# Patient Record
Sex: Male | Born: 1995 | Race: White | Hispanic: No | Marital: Single | Smoking: Never smoker
Health system: Southern US, Community
[De-identification: ages and names within clinical notes are randomized; demographics above are authoritative.]

## PROBLEM LIST (undated history)

## (undated) DIAGNOSIS — N200 Calculus of kidney: Secondary | ICD-10-CM

## (undated) DIAGNOSIS — F909 Attention-deficit hyperactivity disorder, unspecified type: Secondary | ICD-10-CM

---
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
## 2017-12-10 ENCOUNTER — Ambulatory Visit: Payer: Self-pay | Admitting: Psychiatry

## 2017-12-10 ENCOUNTER — Encounter: Payer: Self-pay | Admitting: Psychiatry

## 2017-12-10 VITALS — BP 118/72 | HR 68 | Ht 66.0 in | Wt 177.0 lb

## 2017-12-10 DIAGNOSIS — F429 Obsessive-compulsive disorder, unspecified: Secondary | ICD-10-CM | POA: Insufficient documentation

## 2017-12-10 DIAGNOSIS — F341 Dysthymic disorder: Secondary | ICD-10-CM

## 2017-12-10 DIAGNOSIS — F902 Attention-deficit hyperactivity disorder, combined type: Secondary | ICD-10-CM

## 2017-12-10 DIAGNOSIS — F909 Attention-deficit hyperactivity disorder, unspecified type: Secondary | ICD-10-CM

## 2017-12-10 DIAGNOSIS — F428 Other obsessive-compulsive disorder: Secondary | ICD-10-CM

## 2017-12-10 MED ORDER — METHYLPHENIDATE HCL ER (LA) 20 MG PO CP24: 20.0000 mg | ORAL_CAPSULE | Freq: Every day | ORAL | 0 refills | Status: DC

## 2017-12-10 MED ORDER — METHYLPHENIDATE HCL ER (LA) 20 MG PO CP24: 20.0000 mg | ORAL_CAPSULE | ORAL | 0 refills | Status: DC

## 2017-12-10 MED ORDER — FLUOXETINE HCL 20 MG PO CAPS: 20.0000 mg | ORAL_CAPSULE | Freq: Every day | ORAL | 1 refills | Status: DC

## 2017-12-10 NOTE — Progress Notes (Signed)
[facility]  Patient ID: Gary Solomon: [id number]  PCP: Gary Solomon  Date of Evaluation: 12/10/2017 Time spent:25 minutes  Chief Complaint:  Chief Complaint    ADHD; Depression; Anxiety      HISTORY/CURRENT STATUS: Gary Solomon is seen conjointly with mother face-to-face with consent not collateral for psychiatric interview and exam in 3740-month evaluation and management of ADHD, OCD, and dysthymic disorder.  After 2 years of treatment with Zoloft, mother concludes to palate or loss of efficacy as he is gaining weight, and active, and anhedonic.  His apathy is translating to doubt about completing school when the work over the first year of his treatment was intense to gain his reentry into school.  He would not work for [employer] Solomon after graduation but does have an interest in a certain So that he wonders if he could get on that team.  He [place] [facility] [employer] [facility] Schechner, PsyD missing recent appointments.  Mother brings him at a routine time possibly a few weeks early with concern for mounting depression and anxious inattentive underachievement.  There seek an alternative to Zoloft and for the patient to become compliant with his Ritalin again which he does not take on the weekends however.  In fact he suggests that he has quit taking most of his medication particularly in the last month or 2.  Depression         The patient presents with no depression.  This is a chronic problem.  The current episode started more than 1 year ago.   The onset quality is gradual.   The problem occurs daily.  The most recent episode lasted 4 weeks.    The problem has been gradually worsening since onset.  Associated symptoms include decreased concentration, fatigue, helplessness, hopelessness, decreased interest, appetite change, myalgias, headaches and sad.  Associated symptoms include does not have insomnia, not irritable, no restlessness, no body  aches, no indigestion and no suicidal ideas.     The symptoms are aggravated by work stress, social issues, medication and medication withdrawal.  Past treatments include SSRIs - Selective serotonin reuptake inhibitors, psychotherapy and other medications.  Compliance with treatment is variable.  Past compliance problems include medical issues and medication issues.  Previous treatment provided mild relief.  Risk factors include a change in medication usage/dosage, family history of mental illness, major life event, history of mental illness, stress and the patient not taking medications correctly.   Past medical history includes anxiety, mental health disorder and suicide attempts.     Pertinent negatives include no chronic pain, no fibromyalgia, no thyroid problem, no recent illness, no life-threatening condition, no physical disability, no recent psychiatric admission, no brain trauma, no bipolar disorder, no eating disorder, no depression, no obsessive-compulsive disorder, no post-traumatic stress disorder, no schizophrenia and no head trauma. Anxiety  Symptoms include decreased concentration. Patient reports no insomnia, restlessness or suicidal ideas.   His past medical history is significant for suicide attempts. There is no history of depression.    Individual Medical History/ Review of Systems: Changes? :Yes .  Patient is gained 25 pounds in 6 months mother suggesting he is eating more as he progresses to staying in his room or in bed not getting his assignments done so that the Gary Solomon is notifying her he is in jeopardy.  He has had dental malocclusion, episodic tinnitus, and tension myalgia but no current complaints are evident.  Allergies: Patient has no known allergies.  Current Medications:  Current Outpatient Medications:  .  FLUoxetine (PROZAC) 20 MG capsule, Take 1 capsule (20 mg total) by mouth daily after breakfast., Disp: 90 capsule, Rfl: 1 .  methylphenidate (RITALIN LA) 20 MG  24 hr capsule, Take 1 capsule (20 mg total) by mouth daily after breakfast., Disp: 30 capsule, Rfl: 0 .  [START ON 01/09/2018] methylphenidate (RITALIN LA) 20 MG 24 hr capsule, Take 1 capsule (20 mg total) by mouth every morning., Disp: 30 capsule, Rfl: 0 .  [START ON 02/08/2018] methylphenidate (RITALIN LA) 20 MG 24 hr capsule, Take 1 capsule (20 mg total) by mouth every morning., Disp: 30 capsule, Rfl: 0 .  polyethylene glycol powder (GLYCOLAX/MIRALAX) powder, Take 17 g by mouth daily. Important to take this every day, Disp: 527 g, Rfl: 12 .  tretinoin (RETIN-A) 0.01 % gel, Apply topically at bedtime., Disp: 45 g, Rfl: 5 Medication Side Effects: Who palate and noncompliance with Zoloft no longer facilitating relief of anxiety and dysthymia or facilitation of focus and impulse control suggesting poop out.  Family Medical/ Social History: Changes? Yes mother has taken Wellbutrin for dysthymia and father and paternal grandfather took Prozac for OCD.  MENTAL HEALTH EXAM: Muscle strengths 5/5 and postural reflexes 0/0 with AIMS equals 0. Blood pressure 118/72, pulse 68, height 5\' 6"  (1.676 m), weight 177 lb (80.3 kg).Body mass index is 28.57 kg/m.  General Appearance: Casual, Disheveled, Guarded and Obese  Eye Contact:  Fair  Speech:  Blocked, Garbled and Talkative  Volume:  Normal  Mood:  Anxious, Depressed, Dysphoric, Irritable and Worthless  Affect:  Constricted, Inappropriate, Full Range and Anxious  Thought Process:  Goal Directed  Orientation:  Full (Time, Place, and Person)  Thought Content: Illogical and Rumination   Suicidal Thoughts:  No  Homicidal Thoughts:  No  Memory:  Immediate;   Poor Remote;   Fair  Judgement:  Fair  Insight:  Lacking  Psychomotor Activity:  Increased  Concentration:  Concentration: Poor  Recall:  [employer] of Knowledge: Fair  Language: Good  Assets:  Resilience  ADL's:  Intact  Cognition: WNL  Prognosis:  Good    DIAGNOSES:    ICD-10-CM   1.  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 Attention deficit hyperactivity disorder (ADHD), combined type, moderate F90.2 methylphenidate (RITALIN LA) 20 MG 24 hr capsule    methylphenidate (RITALIN LA) 20 MG 24 hr capsule    methylphenidate (RITALIN LA) 20 MG 24 hr capsule  3. Other obsessive-compulsive disorders F42.8 FLUoxetine (PROZAC) 20 MG capsule  4. Attention deficit hyperactivity disorder (ADHD), unspecified ADHD type F90.9     Receiving Psychotherapy: Yes Civil engineer, contracting, PsyD   RECOMMENDATIONS: Patient and mother rework past treatment and relative success in restoring participation in school as he subsequently has regressed through this semester to having relative failure which mother anticipates he can restore before the end of the semester without penalty.  We plan the change in medication to facilitate his capacity affectively to become successful again.  The patient is somewhat apathetic in that regard so that Zoloft is stopped abruptly.  Prozac is started 20 mg every morning prescribed is a 90-day supply and 1 refill sent to [facility] on [address] in [place].  His Ritalin is updated 20 mg LA generic capsule as 1 every morning for a 30-day supply each for November, December, and January.  They decline to return in 6 months unless absolutely necessary otherwise expecting him to continue  his [employer] work and return in 6 months.  He continues psychotherapy and University support.  They are educated on medications warnings and risks of diagnoses and treatment for prevention and monitoring, safety hygiene, and crisis plans if needed in establishing exposure response prevention CBT for social skills, sleep hygiene, learning strategies and behavioral nutrition.   Chauncey Mann, Solomon

## 2018-02-25 ENCOUNTER — Encounter: Payer: Self-pay | Admitting: Emergency Medicine

## 2018-06-24 ENCOUNTER — Ambulatory Visit (INDEPENDENT_AMBULATORY_CARE_PROVIDER_SITE_OTHER): Payer: BC Managed Care – PPO | Admitting: Psychiatry

## 2018-06-24 ENCOUNTER — Encounter: Payer: Self-pay | Admitting: Psychiatry

## 2018-06-24 ENCOUNTER — Other Ambulatory Visit: Payer: Self-pay

## 2018-06-24 VITALS — Ht 67.0 in | Wt 175.0 lb

## 2018-06-24 DIAGNOSIS — F902 Attention-deficit hyperactivity disorder, combined type: Secondary | ICD-10-CM

## 2018-06-24 DIAGNOSIS — F422 Mixed obsessional thoughts and acts: Secondary | ICD-10-CM | POA: Diagnosis not present

## 2018-06-24 DIAGNOSIS — F341 Dysthymic disorder: Secondary | ICD-10-CM

## 2018-06-24 MED ORDER — METHYLPHENIDATE HCL ER (LA) 20 MG PO CP24: 20.0000 mg | ORAL_CAPSULE | Freq: Every day | ORAL | 0 refills | Status: DC

## 2018-06-24 MED ORDER — FLUOXETINE HCL 20 MG PO CAPS: 20.0000 mg | ORAL_CAPSULE | Freq: Every day | ORAL | 1 refills | Status: DC

## 2018-06-24 NOTE — Progress Notes (Signed)
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  At last appointment, mother concluded Zoloft had pooped out while patient suggested he may have been noncompliant with Zoloft which was switched therefore to Prozac 20 mg every morning in addition to Ritalin LA.  The patient is at his dorm in [place] in case he is needed at the [employer] for any of his delayed work, but he often works slow and to a limited extent in that environment.  Interim medical stressors as well as psychosocial and academic expectations are reviewed with patient simply concluding he is trying to still catch up but expects he has behind.  He prefers the Prozac and is pleased with current medications, though he is slow to make progress on his treatment targets.  He has no anticipated graduation date today but thinks he is still in reasonable standing with University rather than back on probation academically.  He has no mania, suicidality, psychosis, or delirium.  He has no substance use concerns.  Depression         The patient presents with chronic depression that started more than 1 year ago.   The onset quality is gradual.   The problem occurs daily.  The most recent exacerbation was over 6 months ago. The problem has been gradually worsening since onset.  Associated symptoms include decreased sustained concentration, fatigue, helplessness, hopelessness, decreased interest, appetite change,  myalgias, headaches and sad.  Associated symptoms include does not have insomnia, not irritable, no restlessness, no body aches, no indigestion and no suicidal ideas.     The symptoms are aggravated by work stress, social issues, medication and medication withdrawal.  Past treatments include SSRIs - Selective serotonin reuptake inhibitors, psychotherapy and other medications.  Compliance with treatment is variable.  Past compliance problems include medical issues and medication issues.  Previous treatment provided mild relief.  Risk factors include a change in medication usage/dosage, family history of mental illness, major life event, history of mental illness, stress and the patient not taking medications correctly.   Past medical history includes anxiety, mental health disorder and suicide attempts.     Pertinent negatives include no chronic pain, no fibromyalgia, no thyroid problem, no recent illness, no life-threatening condition, no physical disability, no recent psychiatric admission, no brain trauma, no bipolar disorder, no eating disorder, no depression, no obsessive-compulsive disorder, no post-traumatic stress disorder, no schizophrenia and no head trauma. Individual Medical History/ Review of Systems: Changes? :Yes Seeming less apathetic and more engaging though still relatively ineffective at completing a task or conversation.  He reports a sore throat 2 weeks ago for 5 days apparently not requiring specialized treatment while ureteral lithiasis quiring ureteroscopy is resolved but now he fears having a stone on the left side.  He is not aware of other precipitants including dehydration alteration in nutrition.  Allergies: Patient has no known allergies.  Current Medications:  Current Outpatient Medications:  .  FLUoxetine (PROZAC) 20 MG capsule, Take 1 capsule (20 mg total) by mouth daily after  breakfast., Disp: 90 capsule, Rfl: 1 .  methylphenidate (RITALIN LA) 20 MG 24 hr capsule, Take 1  capsule (20 mg total) by mouth daily after breakfast for 30 days., Disp: 30 capsule, Rfl: 0 .  [START ON 07/24/2018] methylphenidate (RITALIN LA) 20 MG 24 hr capsule, Take 1 capsule (20 mg total) by mouth daily after breakfast for 30 days., Disp: 30 capsule, Rfl: 0 .  [START ON 08/23/2018] methylphenidate (RITALIN LA) 20 MG 24 hr capsule, Take 1 capsule (20 mg total) by mouth daily after breakfast for 30 days., Disp: 30 capsule, Rfl: 0 .  polyethylene glycol powder (GLYCOLAX/MIRALAX) powder, Take 17 g by mouth daily. Important to take this every day, Disp: 527 g, Rfl: 12 .  tretinoin (RETIN-A) 0.01 % gel, Apply topically at bedtime., Disp: 45 g, Rfl: 5   Medication Side Effects: none  Family Medical/ Social History: Changes? No  MENTAL HEALTH EXAM:  Height 5\' 7"  (1.702 m), weight 175 lb (79.4 kg).Body mass index is 27.41 kg/m.  As not present here in office today  General Appearance: N/A  Eye Contact:  N/A  Speech:  Clear and Coherent, Garbled and Slow  Volume:  Normal  Mood:  Anxious, Depressed, Dysphoric, Irritable and Worthless  Affect:  Constricted, Depressed, Inappropriate and Anxious  Thought Process:  Coherent, Irrelevant and Linear  Orientation:  Full (Time, Place, and Person)  Thought Content: Ilusions, Obsessions and Rumination   Suicidal Thoughts:  No  Homicidal Thoughts:  No  Memory:  Immediate;   Fair Remote;   Fair  Judgement:  Fair  Insight:  Lacking and Shallow  Psychomotor Activity:  Normal, Increased, Mannerisms and Psychomotor Retardation  Concentration:  Concentration: Poor and Attention Span: Poor  Recall:  [employer]  Fund of Knowledge: Fair  Language: Fair  Assets:  Resilience Social Support Talents/Skills  ADL's:  Intact  Cognition: WNL  Prognosis:  Fair    DIAGNOSES:    ICD-10-CM   1. Attention deficit hyperactivity disorder (ADHD), combined type, moderate F90.2 methylphenidate (RITALIN LA) 20 MG 24 hr capsule    methylphenidate (RITALIN LA) 20 MG 24 hr  capsule    methylphenidate (RITALIN LA) 20 MG 24 hr capsule  2. 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 Mixed obsessional thoughts and acts F42.2   4. Other obsessive-compulsive disorders F42.8 FLUoxetine (PROZAC) 20 MG capsule    Receiving Psychotherapy: Yes available withJoannah Schechner, PsyD though doubtfully attending.   RECOMMENDATIONS: Psychosupportive psychoeducation is provided regarding safety and need for compliance and application relative to Prozac and Ritalin LA.  The patient has a somewhat anxious character avoidance that fixated and obsessively slow academic work. I emphasized his medication and academic application daily routines and responsibilities going toward setting a graduation date.  He is E scribed fluoxetine 20 mg daily after breakfast as a 90-day supply and 1 [facility]ill [facility] on [address] in [place] for OCD/ADHD and dysthymia.  OCD is currently concluded to have mixed obsessional thoughts and acts comorbid with ADHD.  Ritalin LA 20 mg every morning is E scribed to [facility] [place] on [address] as #30 each for June 10, July 10, and August 9 for ADHD.  He returns for follow up in 6 months.    Virtual Visit via Video Note  I connected with Gary HissRussell H Louro on 06/24/18 at  3:00 PM EDT by a video enabled telemedicine application and verified that I am speaking with the correct person using two identifiers.  Location: Patient: Individually with patient in his dorm room Provider: [facility]   I discussed the limitations of evaluation and management by telemedicine and the availability of in person appointments. The patient expressed understanding and agreed to proceed.  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  At last appointment, mother concluded Zoloft had pooped out  while patient suggested he may have been noncompliant with Zoloft which was switched therefore to Prozac 20 mg every morning in addition to Ritalin LA.   Observations/Objective: Speech:  Clear and Coherent, Garbled and Slow  Volume:  Normal  Mood:  Anxious, Depressed, Dysphoric, Irritable and Worthless  Affect:  Constricted, Depressed, Inappropriate and Anxious  Thought Process:  Coherent, Irrelevant and Linear  Orientation:  Full (Time, Place, and Person)  Thought Content: Ilusions, Obsessions and Rumination    Assessment and Plan: Psychosupportive psychoeducation is provided regarding safety and need for compliance and application relative to Prozac and Ritalin LA.  The patient has a somewhat anxious character avoidance that fixated and obsessively slow academic work. I emphasized his medication and academic application daily routines and responsibilities going toward setting a graduation date.  He is E scribed fluoxetine 20 mg daily after breakfast as a 90-day supply and 1 refill sent to [facility] on [address] in [place] for OCD/ADHD and dysthymia.  OCD is currently concluded to have mixed obsessional thoughts and acts comorbid with ADHD.  Ritalin LA 20 mg every morning is E scribed to [facility] on [address] as #30 each for June 10, July 10, and August 9 for ADHD.  Follow Up Instructions: He returns for follow up in 6 months.    I discussed the assessment and treatment plan with the patient. The patient was provided an opportunity to ask questions and all were answered. The patient agreed with the plan and demonstrated an understanding of the instructions.   The patient was advised to call back or seek an in-person evaluation if the symptoms worsen or if the condition fails to improve as anticipated.  I provided 15 minutes of non-face-to-face time during this encounter. [employer] meeting #[id number] Meeting password: [email or web address]  Delight Hoh, MD   Delight Hoh, MD

## 2019-03-24 ENCOUNTER — Telehealth: Payer: Self-pay | Admitting: Psychiatry

## 2019-03-24 NOTE — Telephone Encounter (Signed)
Russel's mom called explaining that pt is still at college. He has experienced depressive periods and has missed some classes due to this. She is requesting a letter to go to the school, explaining that he is treated here and what medications he is on. I see that his last appt was 06/2018, so he is overdue for appt. Mother said she wanted [place] to call and schedule him a follow up after he returns home March 25.  Of note, his grandmother is in hospice care right now too.

## 2019-03-25 NOTE — Telephone Encounter (Signed)
Transcribed as requested and required the relative academic probation professional report.

## 2019-03-26 DIAGNOSIS — Z0289 Encounter for other administrative examinations: Secondary | ICD-10-CM

## 2019-04-08 ENCOUNTER — Other Ambulatory Visit: Payer: Self-pay

## 2019-04-08 ENCOUNTER — Ambulatory Visit (INDEPENDENT_AMBULATORY_CARE_PROVIDER_SITE_OTHER): Payer: BC Managed Care – PPO | Admitting: Psychiatry

## 2019-04-08 ENCOUNTER — Encounter: Payer: Self-pay | Admitting: Psychiatry

## 2019-04-08 VITALS — Ht 67.0 in | Wt 164.0 lb

## 2019-04-08 DIAGNOSIS — F902 Attention-deficit hyperactivity disorder, combined type: Secondary | ICD-10-CM | POA: Diagnosis not present

## 2019-04-08 DIAGNOSIS — F341 Dysthymic disorder: Secondary | ICD-10-CM

## 2019-04-08 DIAGNOSIS — F422 Mixed obsessional thoughts and acts: Secondary | ICD-10-CM | POA: Diagnosis not present

## 2019-04-08 MED ORDER — FLUOXETINE HCL 40 MG PO CAPS: 40.0000 mg | ORAL_CAPSULE | Freq: Every day | ORAL | 1 refills | Status: DC

## 2019-04-08 MED ORDER — METHYLPHENIDATE HCL ER (LA) 30 MG PO CP24: 30.0000 mg | ORAL_CAPSULE | Freq: Every day | ORAL | 0 refills | Status: DC

## 2019-04-08 NOTE — Progress Notes (Signed)
[facility]  Patient ID: NAJIB COLMENARES,  MRN: [id number]  PCP: Marcha Solders, MD  Date of Evaluation: 04/08/2019 Time spent:25 minutes from 1600 to 6  Chief Complaint:  Chief Complaint    ADHD; Anxiety; Depression      HISTORY/CURRENT STATUS: Stormy Card is seen onsite in office 25 minutes face-to-face individually with mother outside in the car "taking care of things" likely frustrated with his hostile dependence needing individuation with consent with epic collateral for psychiatric interview and exam in 79-month evaluation and management of ADHD/OCD, atypical dysthymia, and protracted transition to adulthood.  With mother not present, the patient can be appropriately confronted about possibility and necessity to therapeutically change to overcome being 3 months overdue for follow-up today, mother requiring that she rescue him from [employer] academic consequences this month by obtaining professional statement from me though patient not seen until now that suggested to the school that patient be medically allowed to make up or defer academic deficiencies particularly relative to family focus on hospice care of grandmother, and adherence to medication.  Patient reviews that several family members have died including paternal grandmother and the family dog on the same day.  Patient validates he is not remembering to take his medications which is continuously undoing therapeutic effort here since 10/05/2015 to facilitate compensation for his diagnoses by which patient can comply with educational to function better at school then home improving relationships and his maturity in transition to adulthood. However, his psychotherapist left town at the onset of [place] and he has no therapist now other than just support through the school.  Patient concludes graduation could be in a few months if he catches up but otherwise hopefully within the next year.  The patient concludes he did not cry at  grandmother's funeral as he describes grieving with daytime residual contributing to morbid dreams but without other associated consequences, though he questions why he dreamed of seeing a woman being raped.  He has no dangerous disruptive behavior himself.  In reviewing diagnoses, change of Zoloft to Prozac 1 year ago,  4 years without restoring Ritalin LA to 30 mg from the reduction to 20 mg for his anxiety and depression when first seen, the opportunity to intensify treatment for patient to become more individually responsible for taking his medication and improving his school efficiency can be secured.  Still he reports that mother comes to [place] once weekly to take him shopping at the grocery so that he has lost weight in the last year of 18 pounds as he sometimes has nothing but Kit[employer] left to eat usually just 1 meal a day because of fexation of being in meal preparation.  He has no mania, suicidality, psychosis or delirium.  Depression  The patient presents with chronic depression that started more than 5 years ago. The onset quality is gradual. The problem occurs daily.The most recent exacerbation was over 15 months ago. The problem has been gradually worseningsince onset.Associated symptoms include decreased sustained concentration,fatigue,helplessness,decreased interest,avoidant anxiety, and reactive and mourning sadness. Associated symptoms include does not have insomnia,nohopelessness, no irritability,no restlessness,no body aches,no indigestion, no appetite change, nomyalgias,no headaches,and no suicidal ideas.The symptoms are aggravated by work stress, social issues, medication and medication withdrawal.Past treatments include SSRIs - Selective serotonin reuptake inhibitors, psychotherapy and other medications.Compliance with treatment is variable.Past compliance problems include medical issues and medication issues.Previous treatment provided  mildrelief.Risk factors include a change in medication usage/dosage, family history of mental illness, major life event, history of mental illness, stress  and the patient not taking medications correctly. Past medical history includes anxiety,mental health disorderand suicide attempts. Pertinent negatives include no chronic pain,no fibromyalgia,no thyroid problem,no recent illness,no life-threatening condition,no physical disability,no recent psychiatric admission,no brain trauma,no bipolar disorder,no eating disorder,no depression,no obsessive-compulsive disorder,no post-traumatic stress disorder,no schizophreniaand no head trauma.  Individual Medical History/ Review of Systems: Changes? :Yes 13 pound weight reduction in the last 16 months so that MI is now 25.7 having no intercurrent illness adequate care suggesting psychological improvement.  Allergies: Patient has no known allergies.  Current Medications:  Current Outpatient Medications:  .  FLUoxetine (PROZAC) 40 MG capsule, Take 1 capsule (40 mg total) by mouth daily after breakfast., Disp: 90 capsule, Rfl: 1 .  methylphenidate (RITALIN LA) 30 MG 24 hr capsule, Take 1 capsule (30 mg total) by mouth daily after breakfast., Disp: 30 capsule, Rfl: 0 .  [START ON 05/08/2019] methylphenidate (RITALIN LA) 30 MG 24 hr capsule, Take 1 capsule (30 mg total) by mouth daily after breakfast., Disp: 30 capsule, Rfl: 0 .  [START ON 06/07/2019] methylphenidate (RITALIN LA) 30 MG 24 hr capsule, Take 1 capsule (30 mg total) by mouth daily after breakfast., Disp: 30 capsule, Rfl: 0 .  polyethylene glycol powder (GLYCOLAX/MIRALAX) powder, Take 17 g by mouth daily. Important to take this every day, Disp: 527 g, Rfl: 12 .  tretinoin (RETIN-A) 0.01 % gel, Apply topically at bedtime., Disp: 45 g, Rfl: 5   Medication Side Effects: none  Family Medical/ Social History: Changes? No  MENTAL HEALTH EXAM:  Height 5\' 7"  (1.702 m), weight 164 lb  (74.4 kg).Body mass index is 25.69 kg/m. Muscle strengths and tone 5/5, postural reflexes and gait 0/0, and AIMS = 0 otherwise deferred for coronavirus shutdown  General Appearance: Casual, Disheveled and Meticulous  Eye Contact:  Fair  Speech:  Clear and Coherent, Garbled and Talkative  Volume:  Normal  Mood:  Anxious, Dysphoric and Euthymic  Affect:  Congruent, Constricted, Depressed, Inappropriate, Full Range and Anxious  Thought Process:  Coherent, Irrelevant, Linear and Descriptions of Associations: Circumstantial  Orientation:  Full (Time, Place, and Person)  Thought Content: Ilusions, Obsessions and Rumination   Suicidal Thoughts:  No  Homicidal Thoughts:  No  Memory:  Immediate;   Good Remote;   Fair  Judgement:  Impaired  Insight:  Fair and Lacking  Psychomotor Activity:  Normal, Increased and Mannerisms  Concentration:  Concentration: Fair and Attention Span: Poor  Recall:  of Knowledge: Good  Language: Fair  Assets:  Leisure Time Resilience Talents/Skills  ADL's:  Intact  Cognition: WNL  Prognosis:  Fair    DIAGNOSES:    ICD-10-CM   1. Attention deficit hyperactivity disorder (ADHD), combined type, moderate  F90.2 methylphenidate (RITALIN LA) 30 MG 24 hr capsule    methylphenidate (RITALIN LA) 30 MG 24 hr capsule    methylphenidate (RITALIN LA) 30 MG 24 hr capsule  2. Mixed obsessional thoughts and acts  F42.2 FLUoxetine (PROZAC) 40 MG capsule  3. Persistent depressive disorder with atypical features, currently moderate  F34.1 FLUoxetine (PROZAC) 40 MG capsule    Receiving Psychotherapy: No    RECOMMENDATIONS: Patient has texting contact with mother about pharmacy and quantity but mother otherwise diverts to patient all responsibility in the session.  OCD more than GAD requires increased Prozac which will also be preventative for any side effects of increasing Ritalin for finishing college.  Psychosupportive psychoeducation mobilizes expectation for  patient by patient to finish and succeed at college then  start on employment, with exposure habit reversal thought stopping response prevention throughout the session for behavioral nutrition, social skills, executive function, and frustration management interventions.  In exchange for increased dosing, we will leave follow-up over the next 6 months to need as determined by patient with a primary goal of complying with medication.  He is Escribed fluoxetine increased to 40 mg capsule every morning after breakfast sent as #90 with 1 refill to [facility] on [address] for OCD and generalized anxiety.  Ritalin LA is increased to 30 mg every morning after breakfast sent as #30 each for March 25, April 24, and May 24 for ADHD to [facility] on [place] .  Chauncey Mann, MD

## 2019-06-23 ENCOUNTER — Encounter (INDEPENDENT_AMBULATORY_CARE_PROVIDER_SITE_OTHER): Payer: Self-pay

## 2019-06-23 ENCOUNTER — Ambulatory Visit (INDEPENDENT_AMBULATORY_CARE_PROVIDER_SITE_OTHER): Payer: BC Managed Care – PPO | Admitting: Psychiatry

## 2019-06-23 ENCOUNTER — Other Ambulatory Visit: Payer: Self-pay

## 2019-06-23 ENCOUNTER — Encounter: Payer: Self-pay | Admitting: Psychiatry

## 2019-06-23 VITALS — Ht 67.0 in | Wt 159.0 lb

## 2019-06-23 DIAGNOSIS — F341 Dysthymic disorder: Secondary | ICD-10-CM | POA: Diagnosis not present

## 2019-06-23 DIAGNOSIS — F902 Attention-deficit hyperactivity disorder, combined type: Secondary | ICD-10-CM | POA: Diagnosis not present

## 2019-06-23 DIAGNOSIS — F422 Mixed obsessional thoughts and acts: Secondary | ICD-10-CM

## 2019-06-23 DIAGNOSIS — F810 Specific reading disorder: Secondary | ICD-10-CM | POA: Diagnosis not present

## 2019-06-23 MED ORDER — LORAZEPAM 0.5 MG PO TABS: 0.5000 mg | ORAL_TABLET | Freq: Three times a day (TID) | ORAL | 0 refills | Status: DC | PRN

## 2019-06-23 NOTE — Progress Notes (Signed)
[facility]  Patient ID: Gary Solomon,  MRN: [id number]  PCP: Georgiann Hahn, MD  Date of Evaluation: 06/23/2019 Time spent:25 minutes from 1440 to 1505  Chief Complaint:  Chief Complaint    ADHD; Depression; Anxiety      HISTORY/CURRENT STATUS: Gary Solomon has been seen here 45 months returning now in 3 months individually face-to-face 25 minutes after conjointly reviewing with mother arriving 20 minutes late for appointment as the next person's appointment is about to start with consent with epic collateral for psychiatric interview and exam in evaluation and management of anxiety attacks and depression currently most over his failure to graduate from [employer] in [place]..  Mother attempts more thoroughly than patient to define his disability for functioning at school and when home with mother. He was provided as required 03/26/2019 for the school relative to COVID absence of patient's individual therapist, hospice care status of grandmother last appointment who then died with patient not crying at her funeral, and patient been having the bizarre dreams of seeing a woman being raped.  The patient has the continuing legacy at college of staying in the bed in his dorm room eating only the snacks mother buys him weekly when she can get there to shop for him, and he does not take his medications regularly. Zoloft was changed to Prozac 1 year ago increased last appointment 3 months ago to 40 mg La every morning for OCD and severe atypical dysthymia.  4 years without restoring Ritalin LA to 30 mg from the reduction to 20 mg for his anxiety and depression when first seen, Ritalin LA was increased again 3 months ago to 30 mg.  The opportunity to intensify treatment for patient to become more individually responsible for taking his medication and improving his school efficiency could not be secured in any way.  Mother writes today having examples of the patient being ineffective and  disabled in attempting to care for himself.  Weight had declined 11 pounds over 9 months at last appointment and is now down another 5 pounds in 3 months.  He has the curious rigid fixation of not taking his medications if he sleeps past noon.  Patient considers he must see a sleep medicine doctor and his primary care physician to figure out what is wrong and mother considers him depressed.  The patient is perplexed as to why [employer] is offered him a job as he is failing out of college..  He becomes sad, angry, and tearful when called out about his anxiety attacks and progressive involution today nothing.  He has panic attacks possibly worst when increasing his Ritalin 30 mg LA as needed on days he must accomplish studies.  He has a refill on Prozac 40 mg every morning with which he must comply for the OCD and depression.  Changing medication is less likely to be helpful if patient is so noncompliant for many reasons.  The patient is inconsistent in his communication style.  He has no current mania, overt psychosis, suicidality, or delirium, though he does have poor communication and marked defensiveness for his cognitive and affective symptoms he does not otherwise fully disclose but possibly explaining the mechanism by which he involutes into staying in bed and eating only snacks losing weight and failing out of college.  PCP prescribed medications as Eliott Nine, PhD provided therapy his first year of college then attending here for GPA 1.62 withD's in the winter and F's in the spring.   Depression  The patient presents withchronicdepression thatstarted more than 5 years ago seemingly organized around the consequences of OCD/ADHD.The onset quality was gradual. The problem occurs daily.The most recent exacerbation was 5 months ago.The problem has been gradually worseningsince onset.Associated symptoms include decreased sustainedconcentration,fatigue,helplessness and  hopelessness,decreased interest,avoidant anxiety, and reactive and mourning sadness. Associated symptoms include no insomnia, no irritability,no restlessness,no body aches,no indigestion, no appetite change, nomyalgias,no headaches,and no suicidal ideas.The symptoms are aggravated by work stress, social issues, medication and medication withdrawal.Past treatments include SSRIs - Selective serotonin reuptake inhibitors, psychotherapy and other medications.Compliance with treatment is variable.Past compliance problems include medical issues and medication issues.Previous treatment provided mildtransient relief.Risk factors include a change in medication usage/dosage, family history of mental illness, major life event, history of mental illness, stress and the patient not taking medications correctly. Past medical history includes anxiety,mental health disorderand suicide attempts. Pertinent negatives include no chronic pain,no fibromyalgia,no thyroid problem,no recent illness,no life-threatening condition,no physical disability,no recent psychiatric admission,no brain trauma,no bipolar disorder,no eating disorder,no post-traumatic stress disorder,no schizophreniaand no head trauma.  Individual Medical History/ Review of Systems: Changes? :Yes 16 pound weight loss in the last year having ureteral stones 16 months ago  Allergies: Patient has no known allergies.  Current Medications:  Current Outpatient Medications:  .  FLUoxetine (PROZAC) 40 MG capsule, Take 1 capsule (40 mg total) by mouth daily after breakfast., Disp: 90 capsule, Rfl: 1 .  LORazepam (ATIVAN) 0.5 MG tablet, Take 1 tablet (0.5 mg total) by mouth 3 (three) times daily as needed for anxiety (agitation)., Disp: 30 tablet, Rfl: 0 .  methylphenidate (RITALIN LA) 30 MG 24 hr capsule, Take 1 capsule (30 mg total) by mouth daily after breakfast., Disp: 30 capsule, Rfl: 0 .  methylphenidate (RITALIN LA) 30  MG 24 hr capsule, Take 1 capsule (30 mg total) by mouth daily after breakfast., Disp: 30 capsule, Rfl: 0 .  methylphenidate (RITALIN LA) 30 MG 24 hr capsule, Take 1 capsule (30 mg total) by mouth daily after breakfast., Disp: 30 capsule, Rfl: 0 .  polyethylene glycol powder (GLYCOLAX/MIRALAX) powder, Take 17 g by mouth daily. Important to take this every day, Disp: 527 g, Rfl: 12 .  tretinoin (RETIN-A) 0.01 % gel, Apply topically at bedtime., Disp: 45 g, Rfl: 5  Medication Side Effects: none  Family Medical/ Social History: Changes? No mother take father taking Prozac for OCD including fixations on gaming.  Paternal grandmother also took Prozac.  32 year old brother works in Biomedical scientist.  Patient was considered borderline Autistic in elementary years PhD  MENTAL HEALTH EXAM:  Height 5\' 7"  (1.702 m), weight 159 lb (72.1 kg).Body mass index is 24.9 kg/m. Muscle strengths and tone 5/5, postural reflexes and gait 0/0, and AIMS = 0.  General Appearance: Casual, Disheveled and Meticulous  Eye Contact:  Fair  Speech:  Clear and Coherent, Garbled and Talkative  Volume:  Normal  Mood:  Anxious, Depressed, Dysphoric, Hopeless and Worthless  Affect:  Congruent, Constricted, Depressed, Inappropriate and Anxious  Thought Process:  Coherent, Goal Directed, Irrelevant, Linear and Descriptions of Associations: Circumstantial  Orientation:  Full (Time, Place, and Person)  Thought Content: Ilusions, Obsessions, Paranoid Ideation and Rumination   Suicidal Thoughts:  No  Homicidal Thoughts:  No  Memory:  Immediate;   Good Remote;   Fair  Judgement:  Impaired  Insight:  Lacking  Psychomotor Activity:  Increased, Decreased, Mannerisms and Restlessness and Psychomotor retardation   Concentration:  Concentration: Poor and Attention Span: Poor  Recall:  [employer] of Knowledge:  Fair  Language: Fair  Assets:  [employer]ire for Improvement Intimacy Talents/Skills  ADL's:  Intact  Cognition: WNL  Prognosis:  Poor     DIAGNOSES:    ICD-10-CM   1. Mixed obsessional thoughts and acts  F42.2 LORazepam (ATIVAN) 0.5 MG tablet  2. Persistent depressive disorder with atypical features, currently severe  F34.1   3. Attention deficit hyperactivity disorder (ADHD), combined type, moderate  F90.2   4. Specific learning disorder, with impairment in reading, severe  F81.0     Receiving Psychotherapy: No    RECOMMENDATIONS: In distinguishing prepanic atypical anxiety and depression from obsessional thoughts approaching delusion, the patient interactively manifest more need for Ativan while historically from mother's perspective he may benefit more from Abilify or Seroquel.  He is initially treated with Ativan 0.5 g 3 times daily as needed for anxiety or agitation.  He has current refill on Prozac 40 mg every morning and his current Ritalin 30 mg LA every morning will be made as needed restart when anxiety and free delusional obsession are contained.  His previous psychologist from college last tested in 09/22/2015 including ADHD combined and specific learning disorder with impairment in reading.  He returns for follow-up in 4 weeks or sooner if needed apparently to live at home with mother having no other resources or preparations in life except having been told maybe [place] would hire him.   Chauncey Mann, MD

## 2019-07-05 DIAGNOSIS — F810 Specific reading disorder: Secondary | ICD-10-CM | POA: Insufficient documentation

## 2019-07-21 ENCOUNTER — Ambulatory Visit (INDEPENDENT_AMBULATORY_CARE_PROVIDER_SITE_OTHER): Payer: BC Managed Care – PPO | Admitting: Psychiatry

## 2019-07-21 ENCOUNTER — Encounter: Payer: Self-pay | Admitting: Psychiatry

## 2019-07-21 ENCOUNTER — Other Ambulatory Visit: Payer: Self-pay

## 2019-07-21 VITALS — Ht 67.0 in | Wt 156.0 lb

## 2019-07-21 DIAGNOSIS — F810 Specific reading disorder: Secondary | ICD-10-CM

## 2019-07-21 DIAGNOSIS — F422 Mixed obsessional thoughts and acts: Secondary | ICD-10-CM | POA: Diagnosis not present

## 2019-07-21 DIAGNOSIS — F341 Dysthymic disorder: Secondary | ICD-10-CM

## 2019-07-21 DIAGNOSIS — F902 Attention-deficit hyperactivity disorder, combined type: Secondary | ICD-10-CM | POA: Diagnosis not present

## 2019-07-21 NOTE — Progress Notes (Signed)
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  Though the patient primarily presents for medications as diagnoses are clarified making steps for academic or career pursuit updating, it is not possible to gain any clear competent estimate of the patient's medication compliance.  He just reports that mother found his pills seeming to refer to the Prozac, and [place] registry documents only filling the Ativan 0.5 mg #30 on 07/03/2019 the date of last appointment with the last fill of Ritalin LA being 1 year before for the 20 mg not yet filling the increase at March appointment to 30 mg when  Prozac was to advance from 20 to 40 mg.  Similarly he cannot identify taking any Ativan as though none was necessary.  Whereas mother has been desperate seeking disability and therapeutics at March appointment which was the first in 9 months, he is now here for the third appointment in 3 months with mother extending no further concerns and patient suggesting he is better back home.  He suggests he and mother are looking to [employer] for his education.  He reports a couple of dreams of his teeth falling out which he has determined to be an equivalent of difficulty tolerating change shared after I explain to him the relative absence of teeth at birth and at times in older age hopefully explaining to him the issue of intolerance of but need for change.  He denies any interim anxiety attacks suggesting these often are triggered by being  called out for saying or doing something.  He reports contact with 1 friend in [place] from college maintaining a relationship with a plan to meet for an out of town visit.  He reviews with me previous care of Eliott Nine, PhD as we discuss opportunity for further therapy.  However Seroquel or Abilify discussed the last 3 months seem less likely to be beneficial especially if not compliant.  He must first titrate fully Prozac to 60 mg for OCD  discussed with patient and sent to mother today.  He has no mania, suicidality, psychosis or delirium.    Depression The patient presents withchronicdepression thatstarted more than5yearsago seemingly organized around the consequences of OCD/ADHD.The onset quality was gradual. The problem occurs daily.The most recent exacerbation was 31months ago.The problem has been gradually worseningsince onset.Associated symptoms include decreased concentration,fatigue,helplessness and hopelessness,decreased interest,avoidant anxiety, and reactive sadness. Associated symptoms include no insomnia,no irritability,no restlessness,no body aches,no indigestion, noappetite change,nomyalgias,noheadaches,no misperceptions, and no suicidal ideas.The symptoms are aggravated by work stress, social issues, medication and medication withdrawal.Past treatments include SSRIs - Selective serotonin reuptake inhibitors, psychotherapy and other medications.Compliance with treatment is variable.Past compliance problems include medical issues and medication issues.Previous treatment provided mildtransient relief.Risk factors include a change in medication usage/dosage, family history of mental illness, major life event, history of mental illness, stress and the patient not taking medications correctly. Past medical history includes anxiety,mental health disorderand suicide attempts. Pertinent negatives include no chronic pain,no  fibromyalgia,no thyroid problem,no recent illness,no life-threatening condition,no physical disability,no recent psychiatric admission,no brain trauma,no bipolar disorder,no  eating disorder,no post-traumatic stress disorder,no schizophreniaand no head trauma.  Individual Medical History/ Review of Systems: Changes? :Yes 11 pound weight loss over 9 months as of March now with additional 8 pound loss in the last 3 months.  Allergies: Patient has no known allergies.  Current Medications:  Current Outpatient Medications:  .  FLUoxetine (PROZAC) 40 MG capsule, Take 1 capsule (40 mg total) by mouth daily after breakfast., Disp: 90 capsule, Rfl: 1 .  LORazepam (ATIVAN) 0.5 MG tablet, Take 1 tablet (0.5 mg total) by mouth 3 (three) times daily as needed for anxiety (agitation)., Disp: 30 tablet, Rfl: 0 .  methylphenidate (RITALIN LA) 30 MG 24 hr capsule, Take 1 capsule (30 mg total) by mouth daily after breakfast., Disp: 30 capsule, Rfl: 0 .  methylphenidate (RITALIN LA) 30 MG 24 hr capsule, Take 1 capsule (30 mg total) by mouth daily after breakfast., Disp: 30 capsule, Rfl: 0 .  methylphenidate (RITALIN LA) 30 MG 24 hr capsule, Take 1 capsule (30 mg total) by mouth daily after breakfast., Disp: 30 capsule, Rfl: 0 .  polyethylene glycol powder (GLYCOLAX/MIRALAX) powder, Take 17 g by mouth daily. Important to take this every day, Disp: 527 g, Rfl: 12 .  tretinoin (RETIN-A) 0.01 % gel, Apply topically at bedtime., Disp: 45 g, Rfl: 5  Medication Side Effects: none  Family Medical/ Social History: Changes? No mother with history of that parents divorced when patient was 50 years of age with paternal grandfather and father taking Prozac.  Mother has taken Wellbutrin for chronic depression and anxiety.  MENTAL HEALTH EXAM:  Height 5\' 7"  (1.702 m), weight 156 lb (70.8 kg).Body mass index is 24.43 kg/m. Muscle strengths and tone 5/5, postural reflexes and gait 0/0, and AIMS = 0.  General  Appearance: Casual, Disheveled and Meticulous  Eye Contact:  Fair  Speech:  Clear and Coherent, Normal Rate and Talkative  Volume:  Normal  Mood:  Anxious, Depressed, Dysphoric and Euthymic  Affect:  Congruent, Depressed, Inappropriate, Full Range and Anxious  Thought Process:  Coherent, Goal Directed, Irrelevant, Linear and Descriptions of Associations: Circumstantial  Orientation:  Full (Time, Place, and Person)  Thought Content: Logical, Ilusions, Obsessions and Rumination   Suicidal Thoughts:  No  Homicidal Thoughts:  No  Memory:  Immediate;   Good Remote;   Fair  Judgement:  Fair to Limited  Insight:  Fair and Lacking  Psychomotor Activity:  Normal, Decreased, Mannerisms and Restlessness  Concentration:  Concentration: Fair and Attention Span: Poor  Recall:  of Knowledge: Fair  Language: Fair  Assets:  Desire for Improvement Intimacy Resilience Talents/Skills  ADL's:  Intact  Cognition: WNL  Prognosis:  Poor    DIAGNOSES:    ICD-10-CM   1. Mixed obsessional thoughts and acts  F42.2   2. Persistent depressive disorder with atypical features, currently severe  F34.1   3. Attention deficit hyperactivity disorder (ADHD), combined type, moderate  F90.2   4. Specific learning disorder, with impairment in reading, severe  F81.0     Receiving Psychotherapy: No    RECOMMENDATIONS: Confrontation and clarification of diagnoses and treatment options and needs allow over 50% of the 25-minute face-to-face session time today to be spent in 15 minutes of counseling and coordination of care integrating cognitive behavioral nutrition, sleep hygiene, social problem-solving, and frustration management interventions with symptom treatment matching of medications hopefully to increase Prozac from 40 mg to 60 mg every morning for OCD and depression having previous supply of 20 and  40 mg for such but sending AVS to mother with that expectation as she found his supplies to contact the  office for a refill any time needed.  He has current supply at pharmacy not yet dispensed for Ritalin 30 mg LA every morning not definitely taking it according to [facility] registry for ADHD and atypical depression.  He has current supply of Ativan 0.5 mg up to 3 times daily as needed for high anxiety or panic not needed in the last 4 weeks medically suggesting that depression and anxiety have stabilized somewhat that OCD can now improve. Medications such as Seroquel and Abilify can be considered but findings on mental status suggest need at 1 and 3 months ago now improved.  He returns for follow-up in 2 months patient implying as he is feeling and doing better than 3 months ago he may defer to the appointment as he has not to be a year in the past at college when he needs to follow-up exactly at 2 months if not sooner.   Chauncey Mann, MD

## 2019-07-21 NOTE — Patient Instructions (Signed)
Discussion about Prozac 20 mg tablet being as a total of 60 mg which Brennin recalls would be 6 of the half tablets would assure more comprehensive treatment for the OCD part of being stuck as far as school or work daily life go.

## 2019-09-27 ENCOUNTER — Encounter: Payer: Self-pay | Admitting: Psychiatry

## 2019-09-27 ENCOUNTER — Other Ambulatory Visit: Payer: Self-pay

## 2019-09-27 ENCOUNTER — Ambulatory Visit (INDEPENDENT_AMBULATORY_CARE_PROVIDER_SITE_OTHER): Payer: BC Managed Care – PPO | Admitting: Psychiatry

## 2019-09-27 VITALS — Ht 67.0 in | Wt 155.0 lb

## 2019-09-27 DIAGNOSIS — F902 Attention-deficit hyperactivity disorder, combined type: Secondary | ICD-10-CM

## 2019-09-27 DIAGNOSIS — F341 Dysthymic disorder: Secondary | ICD-10-CM | POA: Diagnosis not present

## 2019-09-27 DIAGNOSIS — F810 Specific reading disorder: Secondary | ICD-10-CM

## 2019-09-27 DIAGNOSIS — F422 Mixed obsessional thoughts and acts: Secondary | ICD-10-CM | POA: Diagnosis not present

## 2019-09-27 MED ORDER — METHYLPHENIDATE HCL ER (LA) 30 MG PO CP24: 30.0000 mg | ORAL_CAPSULE | Freq: Every day | ORAL | 0 refills | Status: AC

## 2019-09-27 MED ORDER — FLUOXETINE HCL 40 MG PO CAPS: 40.0000 mg | ORAL_CAPSULE | Freq: Every day | ORAL | 1 refills | Status: DC

## 2019-09-27 MED ORDER — METHYLPHENIDATE HCL ER (LA) 30 MG PO CP24: 30.0000 mg | ORAL_CAPSULE | Freq: Every day | ORAL | 0 refills | Status: DC

## 2019-09-27 NOTE — Progress Notes (Signed)
[facility]  Patient ID: Gary Solomon,  MRN: [id number]  PCP: Georgiann Hahn, MD  Date of Evaluation: 09/27/2019 Time spent:25 minutes  From 1640 to 1705  Chief Complaint:  Chief Complaint    Anxiety; ADHD; Depression      HISTORY/CURRENT STATUS: Gary Solomon is seen onsite in office 25 minutes face-to-face individually with consent with epic collateral for psychiatric interview and exam in 11-month evaluation and management of ADHD/OCD, atypical dysthymia, and learning variances.  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  Mother at that time was anxiously desperate that the patient must be experiencing more mental health decompensation to explain his recurring failures at the [facility].  This fourth appointment in 6 months cannot document medication compliance, preparation or start of [facility], or any family restoration of the patient's transition to adult life.  The patient formulates today that his main issue is he has nothing to say.  He considers life stressful attempting discussions with family about work, learner's permit to drive so that grandfather's car might be given to him before grandfather sells it, and future in Social worker through [employer].  The patient expects he would zone out if driving attempted and cause injury.  Such cognitive and somatic anxiety undermine his taking any further steps toward building a future.  [facility] registry documents that he did obtain dispensing of the Ativan 07/03/2019 at his last Ritalin dispensing was Ritalin 20 mg LA on 06/24/2018.  He acknowledges today he never increased his Prozac from 20 to either 40 or 60 mg for OCD treatment always stating he has an abundant supply at home but apparently never taking it.  Prozac had been started in place of Zoloft at college as father and paternal grandfather have  OCD treated with Prozac while mother takes Wellbutrin for her anxiety and depression.  Mother no longer attends her with patient, and he has not restarted therapy as he may have attempted with Jake Bathe, PsyD in [employer] or Eliott Nine, PhD at [employer] [facility] [employer].  Multiple assessments through his childhood considered autism apparently never concluded or treated, and we have discussed a possible trial of Abilify or Seroquel here for his cognitive anxiety/OCD.  The session is therefore devoted toward taking the first steps of complying with more therapeutic dosing of his medications and scheduling therapy in order to undertake any of the current family-based endeavors toward his future.  He is not manic, psychotic, delirious, or suicidal today.  Depression The patient presents withchronicdepression thatstarted more than5yearsagoseemingly organized around the consequences of OCD/ADHD.The onset qualitywas gradual. The problem occurs daily.The most recent exacerbation was35months ago.The problem has been waxing and waningsince onset.Associated symptoms include decreased concentration,fatigue, learned helplessness, hopelessness,decreased interest,avoidant anxiety, and reactive sadness. Associated symptoms include no insomnia,no irritability,no restlessness,no body aches,no indigestion, noappetite change,nomyalgias,noheadaches,no misperceptions, and no suicidal ideas.The symptoms are aggravated by work stress, social issues, medication and medication withdrawal.Past treatments include SSRIs - Selective serotonin reuptake inhibitors, psychotherapy and other medications.Compliance with treatment is variable.Past compliance problems include medical issues and medication issues.Previous treatment provided mildtransientrelief.Risk factors include a change in medication usage/dosage, family history of mental illness, major  life event, history of mental illness, stress and the patient not taking medications correctly. Past medical history includes anxiety,mental health disorderand suicide attempts. Pertinent negatives include no chronic pain,no fibromyalgia,no thyroid problem,no recent illness,no  life-threatening condition,no physical disability,no recent psychiatric admission,no brain trauma,no bipolar disorder,no eating disorder,no post-traumatic stress disorder,no schizophreniaand no head trauma.  Individual Medical History/ Review of Systems: Changes? :Yes Weight varies from 150s to 170s currently the low side.  Allergies: Patient has no known allergies.  Current Medications:  Current Outpatient Medications:  .  FLUoxetine (PROZAC) 40 MG capsule, Take 1 capsule (40 mg total) by mouth daily after breakfast., Disp: 90 capsule, Rfl: 1 .  LORazepam (ATIVAN) 0.5 MG tablet, Take 1 tablet (0.5 mg total) by mouth 3 (three) times daily as needed for anxiety (agitation)., Disp: 30 tablet, Rfl: 0 .  methylphenidate (RITALIN LA) 30 MG 24 hr capsule, Take 1 capsule (30 mg total) by mouth daily after breakfast., Disp: 30 capsule, Rfl: 0 .  [START ON 10/27/2019] methylphenidate (RITALIN LA) 30 MG 24 hr capsule, Take 1 capsule (30 mg total) by mouth daily after breakfast., Disp: 30 capsule, Rfl: 0 .  [START ON 11/26/2019] methylphenidate (RITALIN LA) 30 MG 24 hr capsule, Take 1 capsule (30 mg total) by mouth daily after breakfast., Disp: 30 capsule, Rfl: 0 .  polyethylene glycol powder (GLYCOLAX/MIRALAX) powder, Take 17 g by mouth daily. Important to take this every day, Disp: 527 g, Rfl: 12 .  tretinoin (RETIN-A) 0.01 % gel, Apply topically at bedtime., Disp: 45 g, Rfl: 5  Medication Side Effects: fatigue/weakness as blunting of personality possibly from higher dose Ritalin in the past  Family Medical/ Social History: Changes? No parents divorced when he was 37 years of age in the family having anxiety, OCD,  or depression with older brother being employed as his mother both seemingly disapproving of the patient's symptoms. Paternal grandmother died in the last year.  MENTAL HEALTH EXAM:  Height 5\' 7"  (1.702 m), weight 155 lb (70.3 kg).Body mass index is 24.28 kg/m. Muscle strengths and tone 5/5, postural reflexes and gait 0/0, and AIMS = 0.  General Appearance: Casual, Disheveled and Meticulous  Eye Contact:  Fair  Speech:  Blocked, Clear and Coherent and Normal Rate  Volume:  Normal  Mood:  Anxious, Depressed, Dysphoric and Worthless  Affect:  Congruent, Depressed, Inappropriate, Full Range and Anxious  Thought Process:  Coherent, Goal Directed, Irrelevant, Linear and Descriptions of Associations: Circumstantial  Orientation:  Full (Time, Place, and Person)  Thought Content: Logical, Ilusions, Obsessions and Rumination   Suicidal Thoughts:  No  Homicidal Thoughts:  No  Memory:  Immediate;   Good and Fair Remote;   Fair  Judgement:  Impaired  Insight:  Lacking  Psychomotor Activity:  Increased, Decreased, Mannerisms, Psychomotor Retardation and Restlessness  Concentration:  Concentration: Fair and Attention Span: Poor  Recall:  of Knowledge: Fair  Language: Fair  Assets:  Housing Leisure Time Physical Health Talents/Skills  ADL's:  Intact  Cognition: WNL  Prognosis:  Poor    DIAGNOSES:    ICD-10-CM   1. Mixed obsessional thoughts and acts  F42.2 FLUoxetine (PROZAC) 40 MG capsule  2. Attention deficit hyperactivity disorder (ADHD), combined type, moderate  F90.2 methylphenidate (RITALIN LA) 30 MG 24 hr capsule    methylphenidate (RITALIN LA) 30 MG 24 hr capsule    methylphenidate (RITALIN LA) 30 MG 24 hr capsule  3. Persistent depressive disorder with atypical features, currently moderate  F34.1 FLUoxetine (PROZAC) 40 MG capsule  4. Specific learning disorder, with impairment in reading, severe  F81.0     Receiving Psychotherapy: No    RECOMMENDATIONS: Over 50%  of the 25-minute face-to-face session time  for total of 15 minutes is spent in counseling and coordination of care for exposure habit reversal thought stopping response prevention sufficient to make steps with medications and therapy.  Prozac is increased to 40 mg every morning scribed his #90 with 1 refill to [facility] on [address] for OCD and dysthymia as patient reports today that he is never taken more than 20 mg of Prozac daily.  Ritalin is increased to 30 mg LA sent as #30 each for September 13, October 13, and November 12 to [facility] on [address] for ADHD as he has no sustained benefit from the 20 mg over time and his worries about any side effects are more OCD symptoms than objective clinical concerns at this point.  He has Ativan as needed 0.5 mg twice daily for high anxiety or agitation particularly as he initiates adult development steps toward a future through rather than shutting down in his options for employment or education/training.  Therapy is specifically planned to obtain appointment with Gretchen Short, LCSW for OCD.  He returns for follow-up in 6 weeks or sooner if needed.  Chauncey Mann, MD

## 2019-11-03 ENCOUNTER — Encounter: Payer: Self-pay | Admitting: Psychiatry

## 2019-11-08 ENCOUNTER — Other Ambulatory Visit: Payer: Self-pay

## 2019-11-08 ENCOUNTER — Encounter: Payer: Self-pay | Admitting: Psychiatry

## 2019-11-08 ENCOUNTER — Ambulatory Visit (INDEPENDENT_AMBULATORY_CARE_PROVIDER_SITE_OTHER): Payer: BC Managed Care – PPO | Admitting: Psychiatry

## 2019-11-08 VITALS — Ht 67.0 in | Wt 151.0 lb

## 2019-11-08 DIAGNOSIS — F341 Dysthymic disorder: Secondary | ICD-10-CM | POA: Diagnosis not present

## 2019-11-08 DIAGNOSIS — F810 Specific reading disorder: Secondary | ICD-10-CM

## 2019-11-08 DIAGNOSIS — F422 Mixed obsessional thoughts and acts: Secondary | ICD-10-CM | POA: Diagnosis not present

## 2019-11-08 DIAGNOSIS — F902 Attention-deficit hyperactivity disorder, combined type: Secondary | ICD-10-CM | POA: Diagnosis not present

## 2019-11-08 NOTE — Progress Notes (Addendum)
[facility]  Patient ID: HARLYN ITALIANO,  MRN: [id number]  PCP: Georgiann Hahn, MD  Date of Evaluation: 11/08/2019 Time spent:25 minutes from 1640 to 1705  Chief Complaint:  Chief Complaint    ADHD; Depression; Anxiety      HISTORY/CURRENT STATUS: Gary Solomon is seen onsite in office 25 minutes face-to-face conjointly with mother with consent with epic collateral for psychiatric interview and exam in 6-week evaluation and management of OCD/ADHD, atypical dysthymia, and dyslexia.  Mother accompanies the patient for the 2nd time since bringing him home from [employer] in [place].  Patient's academic failure continued despite coming home not catching up with his work.  He has no concept that he has transferable credits though such is possible as he and mother consider that he may attend [employer] still with an interest in game development.  We process the course of weight loss continuing for 13 pounds total since being home with mother since appointment here March 25 when mother waited in the car expecting the patient to be responsible in his care but she did attend the next session June 10 as patient has not been responsible even until today.  Ritalin has been reduced from maximum or 40 mg and it appears he takes Prozac 20 mg instead of 40 mg with [place] registry documenting last dispensing of Ritalin 30 mg LA on September 13 and last Ativan on June 9 the day before his appointment here with mother and she attends again today.  Patient has lost another 5 pounds since his last appointment here.  They have not started therapy with anyone having the 2 previous providers in [place] though one mostly for testing.  Patient denies motivation to drive, work, or go to school.  He suggests he does talk online with others but cannot clarify definite pattern of daily use of Prozac or Ritalin.  He appears to rarely need or take Ativan.  We can process as a template for patient that father and paternal  grandmother took Prozac in the past and mother Wellbutrin successfully.  Case closure for my imminent retirement is addressed with one follow-up additionally planned in 7 weeks and hopefully to transfer to advanced practitioner as he also will hopefully start with a therapist in this office.  He has no mania, suicidality, psychosis, substance use, or delirium.  Depression The patient presents withchronicdepression thatstarted more than5yearsagoseemingly organized around the consequences of OCD/ADHD.The onset qualitywas gradual. The problem occurs daily.The most recent exacerbation was45months ago.The problem has been waxing and waningsince onset.Associated symptoms include decreased concentration,obsessional thoughts and slowness, fatigue, learned helplessness,socially fixated repertoire of behavior, decreased interest,avoidant anxiety, learning deficits, and apathetic sadness. Associated symptoms include no insomnia,no irritability,no  hopelessness, no restlessness,no body aches,no indigestion,nomyalgias,noheadaches,nomisperceptions,and no suicidal ideas.The symptoms are aggravated by work stress, social issues, medication and medication withdrawal.Past treatments include SSRIs - Selective serotonin reuptake inhibitors, psychotherapy and other medications.Compliance with treatment is variable.Past compliance problems include medical issues and medication issues.Previous treatment provided mildtransientrelief.Risk factors include a change in medication usage/dosage, family history of mental illness, major life event, history of mental illness, stress and the patient not taking medications correctly. Past medical history includes anxiety,mental health disorderand suicide attempts. Pertinent negatives include no chronic pain,no fibromyalgia,no thyroid problem,no recent illness,no life-threatening condition,no physical disability,no recent  psychiatric admission,no brain trauma,no bipolar disorder,no eating disorder,no post-traumatic stress disorder,no schizophreniaand no head trauma.  Individual Medical History/ Review of Systems: Changes? :Yes with 13 pound weight loss in the last 8 months and history  of right ureteral calculus in winter 2020  Allergies: Patient has no known allergies.  Current Medications:  Current Outpatient Medications:  .  FLUoxetine (PROZAC) 40 MG capsule, Take 1 capsule (40 mg total) by mouth daily after breakfast., Disp: 90 capsule, Rfl: 1 .  LORazepam (ATIVAN) 0.5 MG tablet, Take 1 tablet (0.5 mg total) by mouth 3 (three) times daily as needed for anxiety (agitation)., Disp: 30 tablet, Rfl: 0 .  methylphenidate (RITALIN LA) 30 MG 24 hr capsule, Take 1 capsule (30 mg total) by mouth daily after breakfast., Disp: 30 capsule, Rfl: 0 .  methylphenidate (RITALIN LA) 30 MG 24 hr capsule, Take 1 capsule (30 mg total) by mouth daily after breakfast., Disp: 30 capsule, Rfl: 0 .  methylphenidate (RITALIN LA) 30 MG 24 hr capsule, Take 1 capsule (30 mg total) by mouth daily after breakfast., Disp: 30 capsule, Rfl: 0 .  polyethylene glycol powder (GLYCOLAX/MIRALAX) powder, Take 17 g by mouth daily. Important to take this every day, Disp: 527 g, Rfl: 12 .  tretinoin (RETIN-A) 0.01 % gel, Apply topically at bedtime., Disp: 45 g, Rfl: 5  Medication Side Effects: none  Family Medical/ Social History: Changes? No parents divorced when he was 71 years of age in the family having anxiety, OCD, or depression with older brother being employed as his mother both seemingly disapproving of the patient's symptoms. Paternal grandmother died in the last year.father and paternal grandfather have OCD treated with Prozac while mother takes Wellbutrin for her anxiety and depression.   MENTAL HEALTH EXAM:  Height 5\' 7"  (1.702 m), weight 151 lb (68.5 kg).Body mass index is 23.65 kg/m. Muscle strengths and tone 5/5, postural  reflexes and gait 0/0, and AIMS = 0.  General Appearance: Casual, Disheveled and Meticulous  Eye Contact:  Fair  Speech:  Blocked, Clear and Coherent and Normal Rate  Volume:  Normal  Mood:  Anxious, Depressed, Dysphoric and Worthless  Affect:  Congruent, Depressed, Inappropriate, Full Range and Anxious  Thought Process:  Coherent, Irrelevant, Linear and Descriptions of Associations: Circumstantial  Orientation:  Full (Time, Place, and Person)  Thought Content: Obsessions and Rumination   Suicidal Thoughts:  No  Homicidal Thoughts:  No  Memory:  Immediate;   Good and Fair Remote;   Fair  Judgement:  Impaired  Insight:  Lacking  Psychomotor Activity:  Normal, Increased, Mannerisms and Psychomotor Retardation  Concentration:  Concentration: Fair and Attention Span: Poor  Recall:  Fair  Fund of Knowledge: Good  Language: Fair  Assets:  Housing Leisure Time Physical Health Transportation  ADL's:  Intact  Cognition: WNL  Prognosis:  Poor    DIAGNOSES:    ICD-10-CM   1. Mixed obsessional thoughts and acts  F42.2   2. Attention deficit hyperactivity disorder (ADHD), combined type, moderate  F90.2   3. Persistent depressive disorder with atypical features, currently moderate  F34.1   4. Specific learning disorder, with impairment in reading, severe  F81.0     Receiving Psychotherapy: No    RECOMMENDATIONS: Support and education are provided patient and mother as medications are clarified particular relative to his noncompliance and therapy is emphasized particularly if any activities will be organized for work or school.  They have adequate supply of medications with Ritalin LA 30 mg dispensed 09/27/2018 to take 1 daily after breakfast for ADHD.  He has Prozac 40 mg every bedtime that can be taken anytime of day having a 90-day supply and 1 refill from last appointment 09/27/2019 for OCD and  atypical dysthymia.  He has Ativan 0.5 mg 3 times daily as needed from June 23, 2019 supply #30  to assure that panic, social avoidance, and anxious involution do not undermine job or school interviews.  They consider scheduling therapy today with Thayer Ohm is, LCM [facility] and will reschedule to see me in 7 weeks determining whether continuing care in this office can be useful, patient possibly needing programmatic services such as at [employer], [employer], [place], Catering manager.   Chauncey Mann, MD

## 2019-12-07 ENCOUNTER — Ambulatory Visit: Payer: BC Managed Care – PPO | Admitting: Mental Health

## 2019-12-14 ENCOUNTER — Encounter: Payer: Self-pay | Admitting: Psychiatry

## 2019-12-15 ENCOUNTER — Other Ambulatory Visit: Payer: Self-pay

## 2019-12-15 ENCOUNTER — Ambulatory Visit (INDEPENDENT_AMBULATORY_CARE_PROVIDER_SITE_OTHER): Payer: Self-pay | Admitting: Mental Health

## 2019-12-15 DIAGNOSIS — F902 Attention-deficit hyperactivity disorder, combined type: Secondary | ICD-10-CM

## 2019-12-15 DIAGNOSIS — F422 Mixed obsessional thoughts and acts: Secondary | ICD-10-CM

## 2019-12-15 NOTE — Progress Notes (Addendum)
[facility] Initial Adult Exam  Name: Gary Solomon Date: 12/15/2019 MRN: [id number] DOB: 01-Sep-1995 PCP: Georgiann Hahn, MD  Time spent: 50 minutes  Reason for Visit /Presenting Problem: He stated he copes w/ "overwhelming anxiety, everything keeps me up at night". Reports this has lasted for a few years, also having panic attacks which occur ever few months. Anxiety persists during the day, increasing at night.  Recently, he reacted upset w/ some friends due to his emotional distress. Typically he supressing feelings "I hold them in". Most of his friends are online, 2 in [place] but they dont talk too often. He stated he plans to separate from the online friends to work on himself. The online group is related to [employer]. He stated the falling out was due to the game. He stated he apologizes too often b/c he cares what people think about him often. This occurs b/c he tends worry a lot about what people think of him. Feels this began in high school. When he ruminates, he may get up for an hour or two then try to sleep again. Sleep schedule is typically 2-4 hours or 6-8, then sometimes 12 when catch-up; inconsistent. He sees Dr Marlyne Beards but has not addressed this sleep issue. He forgets to eat often, his mother reminds him. Avoids eating when not feeling good, maybe when feeling anxious. He stated he goes into "rants" (ruminates) over various stressors, he may think about past / current issues that can range in significance.  He is struggles w/ the idea of taking his medication, therefore avoids. dx'd hx of AD/HD. He is able to take his ritalin medicine as its not in powder form when he opens it to take. He reports coping w/ somatization which we plan further discuss.   Mental Status Exam:    Appearance:    Casual     Behavior:   Appropriate  Motor:   WNL  Speech/Language:    Clear and Coherent  Affect:   Full range   Mood:   Anxious, pleasant  Thought process:   normal   Thought content:     WNL  Sensory/Perceptual disturbances:     none  Orientation:   x4  Attention:   Good  Concentration:   Good  Memory:   Intact  Fund of knowledge:    Consistent with age and development  Insight:     Good  Judgment:    Good  Impulse Control:   Good   Reported Symptoms:  Anxiety, rumination, sleep disturbance, forgets to eat  Risk Assessment: Danger to Self:  denies.  A few years ago had SI but no attempt hx Self-injurious Behavior: No Danger to Others: No Duty to Warn:no Physical Aggression / Violence:No  Access to Firearms a concern: No  Gang Involvement:No  Patient / guardian was educated about steps to take if suicide or homicide risk level increases between visits: yes While future psychiatric events cannot be accurately predicted, the patient does not currently require acute inpatient psychiatric care and does not currently meet [facility] involuntary commitment criteria.  Substance Abuse History: Current substance abuse: denies  Past Psychiatric History:   Outpatient Providers: therapy a few years ago, cannot recall name History of Psych Hospitalization: denied Psychological Testing: none  Medical History/Surgical History:  Past Medical History:  Diagnosis Date  . ADHD (attention deficit hyperactivity disorder)     Medications: Current Outpatient Medications  Medication Sig Dispense Refill  . FLUoxetine (PROZAC) 40 MG capsule Take 1  capsule (40 mg total) by mouth daily after breakfast. 90 capsule 1  . LORazepam (ATIVAN) 0.5 MG tablet Take 1 tablet (0.5 mg total) by mouth 3 (three) times daily as needed for anxiety (agitation). 30 tablet 0  . methylphenidate (RITALIN LA) 30 MG 24 hr capsule Take 1 capsule (30 mg total) by mouth daily after breakfast. 30 capsule 0  . methylphenidate (RITALIN LA) 30 MG 24 hr capsule Take 1 capsule (30 mg total) by mouth daily after breakfast. 30 capsule 0  . methylphenidate (RITALIN LA) 30 MG 24 hr capsule  Take 1 capsule (30 mg total) by mouth daily after breakfast. 30 capsule 0  . polyethylene glycol powder (GLYCOLAX/MIRALAX) powder Take 17 g by mouth daily. Important to take this every day 527 g 12  . tretinoin (RETIN-A) 0.01 % gel Apply topically at bedtime. 45 g 5   No current facility-administered medications for this visit.    No Known Allergies  Diagnoses:    ICD-10-CM   1. Mixed obsessional thoughts and acts  F42.2   2. Attention deficit hyperactivity disorder (ADHD), combined type, moderate  F90.2     Plan of Care: TBD   Waldron Session, [facility]

## 2019-12-27 ENCOUNTER — Ambulatory Visit (INDEPENDENT_AMBULATORY_CARE_PROVIDER_SITE_OTHER): Payer: BC Managed Care – PPO | Admitting: Psychiatry

## 2019-12-27 ENCOUNTER — Other Ambulatory Visit: Payer: Self-pay

## 2019-12-27 ENCOUNTER — Encounter: Payer: Self-pay | Admitting: Psychiatry

## 2019-12-27 VITALS — Ht 67.0 in | Wt 148.0 lb

## 2019-12-27 DIAGNOSIS — F422 Mixed obsessional thoughts and acts: Secondary | ICD-10-CM

## 2019-12-27 DIAGNOSIS — F902 Attention-deficit hyperactivity disorder, combined type: Secondary | ICD-10-CM

## 2019-12-27 DIAGNOSIS — F810 Specific reading disorder: Secondary | ICD-10-CM

## 2019-12-27 DIAGNOSIS — F341 Dysthymic disorder: Secondary | ICD-10-CM

## 2019-12-27 MED ORDER — MIRTAZAPINE 15 MG PO TBDP: 15.0000 mg | ORAL_TABLET | Freq: Every day | ORAL | 2 refills | Status: DC

## 2019-12-27 NOTE — Progress Notes (Signed)
[facility]  Patient ID: Gary Solomon,  MRN: [id number]  PCP: Georgiann Hahn, MD  Date of Evaluation: 12/27/2019 Time spent:20 minutes  From 1655 to 1715  Chief Complaint:  Chief Complaint    ADHD; Depression; Anxiety      HISTORY/CURRENT STATUS: Gary Solomon is seen onsite in office 20 minutes face-to-face individually with consent with epic collateral arriving 30 minutes late for appointment stating he forgot for psychiatric interview and exam in 7-week evaluation and management of ADHD/OCD, atypical dysthymia, and reading disorder.  After coming home from [employer] this spring due to lack of academic participation, the patient did not catch up on his work at home either and is dismissed from [employer].  In fact he has slowly regressed to having similar problems at home he manifested at college for sleep and eating, non-compliance with medication, and occult interaction with family so they do not sufficiently perceive his consequences for these patterns.  Mother works locally in the medical field attending here with patient sporadically but expecting the patient to find therapeutic process or new direction for school or work to secure meaningful daily life.  He did attend 1 psychotherapy session with Elio Forget, [facility] 2 weeks ago but has not returned yet therefore the patient has stopped Ritalin and rarely takes Ativan 0.5 mg.  He takes Prozac 40 mg sometimes, but he notes no efficacy and today is willing to change medications holding the Ritalin until the new medication can start a positive direction for weight and sleep.  Case closure for my imminent retirement is completed today patient willing to transition transfer to [facility], DNP in 2 months for follow-up.  He has no mania, suicidality, psychosis or delirium.  Depression The patient presents withchronicdepression thatstarted more than5yearsagoseemingly organized around the consequences  of OCD/ADHD.The onset qualitywas gradual. The problem occurs daily.The most recent exacerbation was 66months ago.The problem has beenwaxing and waningsince onset.Associated symptoms include decreased concentration, insomnia, obsessional thoughts and slowness, fatigue,learnedhelplessness,socially fixated repertoire of behavior, decreased interest,avoidant anxiety, learning deficits, and apathetic sadness. Associated symptoms include no irritability,no hopelessness, no restlessness,no body aches,no indigestion,nomyalgias,noheadaches,nomisperceptions,and no suicidal ideas.The symptoms are aggravated by work stress, social issues, medication and medication withdrawal.Past treatments include SSRIs - Selective serotonin reuptake inhibitors, psychotherapy and other medications.Compliance with treatment is variable.Past compliance problems include medical issues and medication issues.Previous treatment provided mildtransientrelief.Risk factors include a change in medication usage/dosage, family history of mental illness, major life event, history of mental illness, stress and the patient not taking medications correctly. Past medical history includes anxiety,mental health disorderand suicide attempts. Pertinent negatives include no chronic pain,no fibromyalgia,no thyroid problem,no recent illness,no life-threatening condition,no physical disability,no recent psychiatric admission,no brain trauma,no bipolar disorder,no eating disorder,no post-traumatic stress disorder,no schizophreniaand no head trauma.  Individual Medical History/ Review of Systems: Changes? :Yes He has lost another 3 pounds in 7 weeks not able to sleep and forgetting to eat..  Allergies: Patient has no known allergies.  Current Medications:  Current Outpatient Medications:  .  LORazepam (ATIVAN) 0.5 MG tablet, Take 1 tablet (0.5 mg total) by mouth 3 (three) times daily as needed for  anxiety (agitation)., Disp: 30 tablet, Rfl: 0 .  methylphenidate (RITALIN LA) 30 MG 24 hr capsule, Take 1 capsule (30 mg total) by mouth daily after breakfast., Disp: 30 capsule, Rfl: 0 .  methylphenidate (RITALIN LA) 30 MG 24 hr capsule, Take 1 capsule (30 mg total) by mouth daily after breakfast., Disp: 30 capsule, Rfl: 0 .  methylphenidate (RITALIN  LA) 30 MG 24 hr capsule, Take 1 capsule (30 mg total) by mouth daily after breakfast., Disp: 30 capsule, Rfl: 0 .  mirtazapine (REMERON SOL-TAB) 15 MG disintegrating tablet, Take 1 tablet (15 mg total) by mouth at bedtime., Disp: 30 tablet, Rfl: 2 .  polyethylene glycol powder (GLYCOLAX/MIRALAX) powder, Take 17 g by mouth daily. Important to take this every day, Disp: 527 g, Rfl: 12 .  tretinoin (RETIN-A) 0.01 % gel, Apply topically at bedtime., Disp: 45 g, Rfl: 5  Medication Side Effects: GI irritation and insomnia  Family Medical/ Social History: Changes? No  parents divorced when he was 32 years of age with family having anxiety,OCD, or depression with older brother being employed as his mother both seemingly disapproving of the patient's symptoms. Paternal grandmother died in the last year..Father and paternal grandfather have OCD treated with Prozac while mother takes Wellbutrin for her anxiety and depression.   MENTAL HEALTH EXAM:  Height 5\' 7"  (1.702 m), weight 148 lb (67.1 kg).Body mass index is 23.18 kg/m. Muscle strengths and tone 5/5, postural reflexes and gait 0/0, and AIMS = 0.  General Appearance: Casual, Disheveled and Meticulous  Eye Contact:  Fair  Speech:  Clear and Coherent and Normal Rate  Volume:  Normal  Mood:  Anxious, Depressed and Worthless  Affect:  Congruent, Depressed, Inappropriate, Restricted and Anxious  Thought Process:  Coherent, Irrelevant, Linear and Descriptions of Associations: Circumstantial  Orientation:  Full (Time, Place, and Person)  Thought Content: Obsessions and Rumination   Suicidal Thoughts:  No   Homicidal Thoughts:  No  Memory:  Immediate;   Fair Remote;   Fair  Judgement:  Impaired  Insight:  Lacking  Psychomotor Activity:  Normal, Mannerisms and Psychomotor Retardation  Concentration:  Concentration: Fair and Attention Span: Poor  Recall:  Fair  Fund of Knowledge: Good  Language: Fair  Assets:  Housing Leisure Time Transportation  ADL's:  Intact  Cognition: WNL  Prognosis:  Poor    DIAGNOSES:    ICD-10-CM   1. Attention deficit hyperactivity disorder (ADHD), combined type, moderate  F90.2   2. Mixed obsessional thoughts and acts  F42.2 mirtazapine (REMERON SOL-TAB) 15 MG disintegrating tablet  3. Persistent depressive disorder with atypical features, currently moderate  F34.1 mirtazapine (REMERON SOL-TAB) 15 MG disintegrating tablet  4. Specific learning disorder, with impairment in reading, severe  F81.0     Receiving Psychotherapy: Yes  1 psychotherapy session with , [facility] 2 weeks ago   RECOMMENDATIONS: Through today's intervention integrated with therapy as possible, Ritalin 30 mg LA every morning is held for now for at least the next 2 months considering he has weight loss and inability to sleep.  He does have Ativan if needed for high anxiety but has not needed that 0.5 mg 3 times daily dose in months.  Prozac is discontinued.  He is started on Remeron SolTab 15 mg every bedtime in place of the Prozac sent as #30 with 2 refills to [facility] for OCD and atypical dysthymia as well as insomnia and anorexia.  Case closure for my retirement is completed, and he is scheduled to follow-up with [employer], DNP in 2 months and hopefully continue therapy with Yvette Rack in the interim.  He is updated on prevention and monitoring safety hygiene for the medication and provided crisis plans if needed.   Elio Forget, MD

## 2020-01-24 ENCOUNTER — Other Ambulatory Visit: Payer: Self-pay

## 2020-01-24 ENCOUNTER — Ambulatory Visit (INDEPENDENT_AMBULATORY_CARE_PROVIDER_SITE_OTHER): Payer: Self-pay | Admitting: Mental Health

## 2020-01-24 DIAGNOSIS — F422 Mixed obsessional thoughts and acts: Secondary | ICD-10-CM

## 2020-01-24 NOTE — Progress Notes (Signed)
[facility] Psychotherapy Note  Name: Gary Solomon Date: 01/24/2020 MRN: [id number] DOB: 1995/02/04 PCP: Gary Hahn, MD  Time spent: 53 minutes  Treatment: ind. Therapy  Mental Status Exam:    Appearance:    Casual     Behavior:   Appropriate  Motor:   WNL  Speech/Language:    Clear and Coherent  Affect:   Full range   Mood:   Anxious, pleasant  Thought process:   normal  Thought content:     WNL  Sensory/Perceptual disturbances:     none  Orientation:   x4  Attention:   Good  Concentration:   Good  Memory:   Intact  Fund of knowledge:    Consistent with age and development  Insight:     Good  Judgment:    Good  Impulse Control:   Good   Reported Symptoms:  Anxiety, rumination, sleep disturbance, forgets to eat  Risk Assessment: Danger to Self:  denies.  A few years ago had SI but no attempt hx Self-injurious Behavior: No Danger to Others: No Duty to Warn:no Physical Aggression / Violence:No  Access to Firearms a concern: No  Gang Involvement:No  Patient / guardian was educated about steps to take if suicide or homicide risk level increases between visits: yes While future psychiatric events cannot be accurately predicted, the patient does not currently require acute inpatient psychiatric care and does not currently meet [facility] involuntary commitment criteria.  Substance Abuse History: Current substance abuse: denies  Past Psychiatric History:   Outpatient Providers: therapy a few years ago, cannot recall name History of Psych Hospitalization: denied Psychological Testing: none  Medical History/Surgical History:  Past Medical History:  Diagnosis Date  . ADHD (attention deficit hyperactivity disorder)     Medications: Current Outpatient Medications  Medication Sig Dispense Refill  . LORazepam (ATIVAN) 0.5 MG tablet Take 1 tablet (0.5 mg total) by mouth 3 (three) times daily as needed for anxiety (agitation). 30 tablet 0  .  methylphenidate (RITALIN LA) 30 MG 24 hr capsule Take 1 capsule (30 mg total) by mouth daily after breakfast. 30 capsule 0  . methylphenidate (RITALIN LA) 30 MG 24 hr capsule Take 1 capsule (30 mg total) by mouth daily after breakfast. 30 capsule 0  . methylphenidate (RITALIN LA) 30 MG 24 hr capsule Take 1 capsule (30 mg total) by mouth daily after breakfast. 30 capsule 0  . mirtazapine (REMERON SOL-TAB) 15 MG disintegrating tablet Take 1 tablet (15 mg total) by mouth at bedtime. 30 tablet 2  . polyethylene glycol powder (GLYCOLAX/MIRALAX) powder Take 17 g by mouth daily. Important to take this every day 527 g 12  . tretinoin (RETIN-A) 0.01 % gel Apply topically at bedtime. 45 g 5   No current facility-administered medications for this visit.    Abuse History: Victim: none Report needed: no Perpetrator of abuse: no Witness / Exposure to Domestic Violence:  none Protective Services Involvement: no Witness to [employer] Violence:  no   Family / Social History:   Raised by both parents, until age 56 when his father moved out, then he saw him every few years. 1 brother- age 74. Living situation: lives w/ mother and brother Relationship Status:   single If a parent, number of children / ages:   none  Support Systems: family  Financial Stress:   none  Income/Employment/Disability:  unemployed   Financial planner: none  Educational History:  Some college, major 3d Insurance risk surveyor:  none  Any cultural differences that may affect / interfere with treatment:  none  Recreation/Hobbies: dungeons and dragons, video games  Strengths:    Barriers: none  Legal History: none  Pending legal issue / charges: none  History of legal issue / charges: none     Subjective:  He has been trying to regulate his sleep, has been taking medication as rx'd and it has been helping.  He stated that his sleep sometimes can be excessive he feels due to the  medication where he can sleep up to 12 to 15 hours at a time.  Encouraged him on following up with his medication management appointment to schedule after the session with [facility] as he was in care with Dr. Marlyne Beards prior to his retirement.  He reports continued daily anxiety.  Identified the cognition "do people think I'm useless?" then he starts crying. This was result of some interactions with others online with which he games.  He tends to ruminate, reports continuing to go over issues he is experienced in the past and has had, mentioning the "rent" he had with other friends online about a month ago with which he regrets.  He stated that none of them are upset, but they even make light of it at this point.  We discussed thought stopping as a coping tool.  Interventions: Further assessment, CBT, supportive therapy     Diagnoses:    ICD-10-CM   1. Mixed obsessional thoughts and acts  F42.2     Plan: Patient is to use CBT, mindfulness and coping skills to help manage decrease symptoms associated with their diagnosis.    Patient to utilize coping skills such as thoughts stopping as discussed in session.  Patient to continue to maintain his sleep schedule and take medications as prescribed following up with his next medication management appointment.   Long-term goal:   Reduce overall level, frequency, and intensity of the feelings of depression and anxiety 7/10 to a 0-2/10 in severity for at least 3 consecutive months.   Short-term goal:  Decrease "deflating, self-doubting" and catastrophizing thinking style Decrease anxiety producing self talk such as thinking of the worse possible life outcome Decrease ruminating, especially at night which can affect his sleep   Assessment of progress:  progressing   Waldron Session, [facility]

## 2020-03-07 ENCOUNTER — Other Ambulatory Visit: Payer: Self-pay

## 2020-03-07 ENCOUNTER — Encounter: Payer: Self-pay | Admitting: Adult Health

## 2020-03-07 ENCOUNTER — Ambulatory Visit (INDEPENDENT_AMBULATORY_CARE_PROVIDER_SITE_OTHER): Payer: Self-pay | Admitting: Adult Health

## 2020-03-07 DIAGNOSIS — F810 Specific reading disorder: Secondary | ICD-10-CM

## 2020-03-07 DIAGNOSIS — N201 Calculus of ureter: Secondary | ICD-10-CM | POA: Insufficient documentation

## 2020-03-07 DIAGNOSIS — F341 Dysthymic disorder: Secondary | ICD-10-CM

## 2020-03-07 DIAGNOSIS — F902 Attention-deficit hyperactivity disorder, combined type: Secondary | ICD-10-CM

## 2020-03-07 DIAGNOSIS — F422 Mixed obsessional thoughts and acts: Secondary | ICD-10-CM

## 2020-03-07 MED ORDER — FLUOXETINE HCL 20 MG PO CAPS: 20.0000 mg | ORAL_CAPSULE | Freq: Every day | ORAL | 2 refills | Status: DC

## 2020-03-07 NOTE — Progress Notes (Signed)
Gary Solomon [id number] April 05, 1995 25 y.o.  Subjective:   Patient ID:  Gary Solomon is a 25 y.o. (DOB Jan 08, 1996) male.  Chief Complaint: No chief complaint on file.   HPI [name] presents to the office today for follow-up of ADHD, OCD, atypical dysthymia, and reading disorder.  Describes mood today as "ok". Pleasant. Mood symptoms - depression - "normally when out and about", anxiety - "increased", and irritability - "variable trigger". More anxious overall. Has days where he's fine and other days on the edge of his seat. Stating "every day has it's own things". Ruminations - getting things stuck in his head and thinking about it all day. Feels like he worries over things more than he should. Has self doubting intrusive thoughts. Like things in order - mostly digital and not physical. Trouble keeping room clean more recently. Would like to restart the Prozac 20mg  and discontinue the Remeron. Stable interest and motivation. Taking medications as prescribed.  Energy levels Active, does not have a regular exercise routine. Works full-time  Enjoys some usual interests and activities. Single. Lives at home with mother and brother - 1 cat and 1 dog. Spending time with family. Appetite decreased - "forgetting to eat". Weight loss 148 pounds. Sleeps schedule varies. Does not always feel rested - "sleep is completely random.  Focus and concentration difficulties. Taking Ritalin when needed. Completing tasks. Managing aspects of household.  Denies SI or HI.  Denies AH or VH.  Previous medication trials: Remeron.     Review of Systems:  Review of Systems  Musculoskeletal: Negative for gait problem.  Neurological: Negative for tremors.  Psychiatric/Behavioral:       Please refer to HPI    Medications: I have reviewed the patient's current medications.  Current Outpatient Medications  Medication Sig Dispense Refill  . FLUoxetine (PROZAC) 20 MG capsule Take 1 capsule (20 mg  total) by mouth daily. 30 capsule 2  . LORazepam (ATIVAN) 0.5 MG tablet Take 1 tablet (0.5 mg total) by mouth 3 (three) times daily as needed for anxiety (agitation). 30 tablet 0  . methylphenidate (RITALIN LA) 30 MG 24 hr capsule Take 1 capsule (30 mg total) by mouth daily after breakfast. 30 capsule 0  . methylphenidate (RITALIN LA) 30 MG 24 hr capsule Take 1 capsule (30 mg total) by mouth daily after breakfast. 30 capsule 0  . methylphenidate (RITALIN LA) 30 MG 24 hr capsule Take 1 capsule (30 mg total) by mouth daily after breakfast. 30 capsule 0  . mirtazapine (REMERON SOL-TAB) 15 MG disintegrating tablet Take 1 tablet (15 mg total) by mouth at bedtime. 30 tablet 2  . polyethylene glycol powder (GLYCOLAX/MIRALAX) powder Take 17 g by mouth daily. Important to take this every day 527 g 12  . tretinoin (RETIN-A) 0.01 % gel Apply topically at bedtime. 45 g 5   No current facility-administered medications for this visit.    Medication Side Effects: None  Allergies: No Known Allergies  Past Medical History:  Diagnosis Date  . ADHD (attention deficit hyperactivity disorder)     No family history on file.  Social History   Socioeconomic History  . Marital status: Single    Spouse name: Not on file  . Number of children: Not on file  . Years of education: Not on file  . Highest education level: Not on file  Occupational History  . Not on file  Tobacco Use  . Smoking status: Never Smoker  . Smokeless tobacco: Never Used  Vaping Use  . Vaping Use: Never used  Substance and Sexual Activity  . Alcohol use: No  . Drug use: No  . Sexual activity: Never  Other Topics Concern  . Not on file  Social History Narrative  . Not on file   Social Determinants of Health   Financial Resource Strain: Not on file  Food Insecurity: Not on file  Transportation Needs: Not on file  Physical Activity: Not on file  Stress: Not on file  Social Connections: Not on file  Intimate Partner  Violence: Not on file    Past Medical History, Surgical history, Social history, and Family history were reviewed and updated as appropriate.   Please see review of systems for further details on the patient's review from today.   Objective:   Physical Exam:  There were no vitals taken for this visit.  Physical Exam Constitutional:      General: He is not in acute distress. Musculoskeletal:        General: No deformity.  Neurological:     Mental Status: He is alert and oriented to person, place, and time.     Coordination: Coordination normal.  Psychiatric:        Attention and Perception: Attention and perception normal. He does not perceive auditory or visual hallucinations.        Mood and Affect: Mood normal. Mood is not anxious or depressed. Affect is not labile, blunt, angry or inappropriate.        Speech: Speech normal.        Behavior: Behavior normal.        Thought Content: Thought content normal. Thought content is not paranoid or delusional. Thought content does not include homicidal or suicidal ideation. Thought content does not include homicidal or suicidal plan.        Cognition and Memory: Cognition and memory normal.        Judgment: Judgment normal.     Comments: Insight intact     Lab Review:  No results found for: NA, K, CL, CO2, GLUCOSE, BUN, CREATININE, CALCIUM, PROT, ALBUMIN, AST, ALT, ALKPHOS, BILITOT, GFRNONAA, GFRAA  No results found for: WBC, RBC, HGB, HCT, PLT, MCV, MCH, MCHC, RDW, LYMPHSABS, MONOABS, EOSABS, BASOSABS  No results found for: POCLITH, LITHIUM   No results found for: PHENYTOIN, PHENOBARB, VALPROATE, CBMZ   .res Assessment: Plan:    Plan:  PDMP reviewed  1. Ritalin 30 mg LA every morning - not taken in motnhs 2. Lorazepam 0.5mg  - 3 times daily 3. Add Prozac 20mg  daily x 4 weeks. 4. D/C Remeron 15mg  - too sedating  106/75 - 81  Read and reviewed note with patient for accuracy.   RTC 2 months  Patient advised to  contact office with any questions, adverse effects, or acute worsening in signs and symptoms.     Diagnoses and all orders for this visit:  Mixed obsessional thoughts and acts -     FLUoxetine (PROZAC) 20 MG capsule; Take 1 capsule (20 mg total) by mouth daily.  Attention deficit hyperactivity disorder (ADHD), combined type, moderate  Persistent depressive disorder with atypical features, currently moderate  Specific learning disorder, with impairment in reading, severe     Please see After Visit Summary for patient specific instructions.  No future appointments.  No orders of the defined types were placed in this encounter.   -------------------------------

## 2020-05-05 ENCOUNTER — Other Ambulatory Visit: Payer: Self-pay

## 2020-05-05 ENCOUNTER — Encounter: Payer: Self-pay | Admitting: Adult Health

## 2020-05-05 ENCOUNTER — Ambulatory Visit (INDEPENDENT_AMBULATORY_CARE_PROVIDER_SITE_OTHER): Payer: Self-pay | Admitting: Adult Health

## 2020-05-05 DIAGNOSIS — F902 Attention-deficit hyperactivity disorder, combined type: Secondary | ICD-10-CM

## 2020-05-05 DIAGNOSIS — F341 Dysthymic disorder: Secondary | ICD-10-CM

## 2020-05-05 DIAGNOSIS — F810 Specific reading disorder: Secondary | ICD-10-CM

## 2020-05-05 DIAGNOSIS — F422 Mixed obsessional thoughts and acts: Secondary | ICD-10-CM

## 2020-05-05 NOTE — Progress Notes (Signed)
Gary Solomon [id number] Aug 24, 1995 24 y.o.  Subjective:   Patient ID:  Gary Solomon is a 25 y.o. (DOB 01-Mar-1995) male.  Chief Complaint: No chief complaint on file.   HPI Gary Solomon presents to the office today for follow-up of ADHD, OCD, atypical dysthymia, and reading disorder.  Describes mood today as "ok". Pleasant. Mood symptoms - reports decreased depression, anxiety, and irritability  Feels like addition of Prozac 20mg  daily has been helpful. Has also been taking medications on a schedule. Decreased ruminations - "still  A problem, not doing it as often". Helping out around the house. Stable interest and motivation. Taking medications as prescribed.  Energy levels stable. Active, does not have a regular exercise routine. Enjoys some usual interests and activities. Single. Lives at home with mother and brother - 1 cat and 1 dog. Spending time with family. Appetite varies - forgetting to eat. Set a reminder in the office. Weight loss 136 pounds. Sleep schedule varies - "it's always variable". Averages 5 to 6 hours. Focus and concentration difficulties. Taking Ritalin when needed - none since last visit. Completing tasks. Managing aspects of household.  Denies SI or HI.  Denies AH or VH.  Previous medication trials: Remeron.    Review of Systems:  Review of Systems  Musculoskeletal: Negative for gait problem.  Neurological: Negative for tremors.  Psychiatric/Behavioral:       Please refer to HPI    Medications: I have reviewed the patient's current medications.  Current Outpatient Medications  Medication Sig Dispense Refill  . FLUoxetine (PROZAC) 20 MG capsule Take 1 capsule (20 mg total) by mouth daily. 30 capsule 2  . LORazepam (ATIVAN) 0.5 MG tablet Take 1 tablet (0.5 mg total) by mouth 3 (three) times daily as needed for anxiety (agitation). 30 tablet 0  . methylphenidate (RITALIN LA) 30 MG 24 hr capsule Take 1 capsule (30 mg total) by mouth daily after  breakfast. 30 capsule 0  . methylphenidate (RITALIN LA) 30 MG 24 hr capsule Take 1 capsule (30 mg total) by mouth daily after breakfast. 30 capsule 0  . methylphenidate (RITALIN LA) 30 MG 24 hr capsule Take 1 capsule (30 mg total) by mouth daily after breakfast. 30 capsule 0  . mirtazapine (REMERON SOL-TAB) 15 MG disintegrating tablet Take 1 tablet (15 mg total) by mouth at bedtime. 30 tablet 2  . polyethylene glycol powder (GLYCOLAX/MIRALAX) powder Take 17 g by mouth daily. Important to take this every day 527 g 12  . tretinoin (RETIN-A) 0.01 % gel Apply topically at bedtime. 45 g 5   No current facility-administered medications for this visit.    Medication Side Effects: None  Allergies: No Known Allergies  Past Medical History:  Diagnosis Date  . ADHD (attention deficit hyperactivity disorder)     Past Medical History, Surgical history, Social history, and Family history were reviewed and updated as appropriate.   Please see review of systems for further details on the patient's review from today.   Objective:   Physical Exam:  There were no vitals taken for this visit.  Physical Exam Constitutional:      General: He is not in acute distress. Musculoskeletal:        General: No deformity.  Neurological:     Mental Status: He is alert and oriented to person, place, and time.     Coordination: Coordination normal.  Psychiatric:        Attention and Perception: Attention and perception normal. He does not perceive  auditory or visual hallucinations.        Mood and Affect: Mood normal. Mood is not anxious or depressed. Affect is not labile, blunt, angry or inappropriate.        Speech: Speech normal.        Behavior: Behavior normal.        Thought Content: Thought content normal. Thought content is not paranoid or delusional. Thought content does not include homicidal or suicidal ideation. Thought content does not include homicidal or suicidal plan.        Cognition and  Memory: Cognition and memory normal.        Judgment: Judgment normal.     Comments: Insight intact     Lab Review:  No results found for: NA, K, CL, CO2, GLUCOSE, BUN, CREATININE, CALCIUM, PROT, ALBUMIN, AST, ALT, ALKPHOS, BILITOT, GFRNONAA, GFRAA  No results found for: WBC, RBC, HGB, HCT, PLT, MCV, MCH, MCHC, RDW, LYMPHSABS, MONOABS, EOSABS, BASOSABS  No results found for: POCLITH, LITHIUM   No results found for: PHENYTOIN, PHENOBARB, VALPROATE, CBMZ   .res Assessment: Plan:    Plan:  PDMP reviewed  1. Ritalin 30 mg LA every morning - not taking unless needed 2. Lorazepam 0.5mg  - 3 times daily - not taking 3. Continue Prozac 20mg  daily x 4 weeks.  Not taking stimulant  RTC 2 months  Time spent with patient was 30 minutes. Greater than 50% of face to face time with patient was spent on counseling and coordination of care.    Patient advised to contact office with any questions, adverse effects, or acute worsening in signs and symptoms.  Diagnoses and all orders for this visit:  Mixed obsessional thoughts and acts  Attention deficit hyperactivity disorder (ADHD), combined type, moderate  Persistent depressive disorder with atypical features, currently moderate  Specific learning disorder, with impairment in reading, severe     Please see After Visit Summary for patient specific instructions.  Future Appointments  Date Time Provider Department Center  07/05/2020  5:00 PM Durenda Pechacek, 07/07/2020, NP [facility] None    No orders of the defined types were placed in this encounter.   -------------------------------

## 2020-07-05 ENCOUNTER — Other Ambulatory Visit: Payer: Self-pay

## 2020-07-05 ENCOUNTER — Encounter: Payer: Self-pay | Admitting: Adult Health

## 2020-07-05 ENCOUNTER — Ambulatory Visit (INDEPENDENT_AMBULATORY_CARE_PROVIDER_SITE_OTHER): Payer: Self-pay | Admitting: Adult Health

## 2020-07-05 DIAGNOSIS — F422 Mixed obsessional thoughts and acts: Secondary | ICD-10-CM

## 2020-07-05 DIAGNOSIS — F902 Attention-deficit hyperactivity disorder, combined type: Secondary | ICD-10-CM

## 2020-07-05 DIAGNOSIS — F341 Dysthymic disorder: Secondary | ICD-10-CM

## 2020-07-05 MED ORDER — FLUOXETINE HCL 20 MG PO CAPS: 20.0000 mg | ORAL_CAPSULE | Freq: Every day | ORAL | 5 refills | Status: DC

## 2020-07-05 NOTE — Progress Notes (Signed)
SEITH AIKEY [id number] 10-16-1995 25 y.o.  Subjective:   Patient ID:  Gary Solomon is a 25 y.o. (DOB [birth year]) male.  Chief Complaint: No chief complaint on file.   HPI Gary Solomon presents to the office today for follow-up of ADHD, OCD, atypical dysthymia, and reading disorder.  Describes mood today as "ok". Pleasant. Mood symptoms - reports decreased depression, anxiety, and irritability. Recent anxiety attack. Reports some worry and rumination. Stating "I've had a lot on my mind recently". More "pessimistic" over the past few days". Feels like addition of Prozac 20mg  daily has been helpful - lost prescription in March and restarted it an April. Trying to get into the habit of taking it again. Stable interest and motivation. Taking medications as prescribed.  Energy levels stable. Active, does not have a regular exercise routine. Enjoys some usual interests and activities. Single. Lives at home with mother and brother - 1 cat and 1 dog. Spending time with family. Appetite varies - settling alarms. Weight loss 136 pounds. Sleep schedule varies. Averages 5 to 6 hours. Focus and concentration difficulties. Taking Ritalin when needed - none since last visit. Completing tasks. Managing aspects of household.  Denies SI or HI.  Denies AH or VH.  Previous medication trials: Remeron.       Review of Systems:  Review of Systems  Musculoskeletal:  Negative for gait problem.  Neurological:  Negative for tremors.  Psychiatric/Behavioral:         Please refer to HPI   Medications: I have reviewed the patient's current medications.  Current Outpatient Medications  Medication Sig Dispense Refill   FLUoxetine (PROZAC) 20 MG capsule Take 1 capsule (20 mg total) by mouth daily. 30 capsule 5   LORazepam (ATIVAN) 0.5 MG tablet Take 1 tablet (0.5 mg total) by mouth 3 (three) times daily as needed for anxiety (agitation). 30 tablet 0   methylphenidate (RITALIN LA) 30 MG 24 hr capsule  Take 1 capsule (30 mg total) by mouth daily after breakfast. 30 capsule 0   methylphenidate (RITALIN LA) 30 MG 24 hr capsule Take 1 capsule (30 mg total) by mouth daily after breakfast. 30 capsule 0   methylphenidate (RITALIN LA) 30 MG 24 hr capsule Take 1 capsule (30 mg total) by mouth daily after breakfast. 30 capsule 0   mirtazapine (REMERON SOL-TAB) 15 MG disintegrating tablet Take 1 tablet (15 mg total) by mouth at bedtime. 30 tablet 2   polyethylene glycol powder (GLYCOLAX/MIRALAX) powder Take 17 g by mouth daily. Important to take this every day 527 g 12   tretinoin (RETIN-A) 0.01 % gel Apply topically at bedtime. 45 g 5   No current facility-administered medications for this visit.    Medication Side Effects: None  Allergies: No Known Allergies  Past Medical History:  Diagnosis Date   ADHD (attention deficit hyperactivity disorder)     Past Medical History, Surgical history, Social history, and Family history were reviewed and updated as appropriate.   Please see review of systems for further details on the patient's review from today.   Objective:   Physical Exam:  There were no vitals taken for this visit.  Physical Exam Constitutional:      General: He is not in acute distress. Musculoskeletal:        General: No deformity.  Neurological:     Mental Status: He is alert and oriented to person, place, and time.     Coordination: Coordination normal.  Psychiatric:  Attention and Perception: Attention and perception normal. He does not perceive auditory or visual hallucinations.        Mood and Affect: Mood normal. Mood is not anxious or depressed. Affect is not labile, blunt, angry or inappropriate.        Speech: Speech normal.        Behavior: Behavior normal.        Thought Content: Thought content normal. Thought content is not paranoid or delusional. Thought content does not include homicidal or suicidal ideation. Thought content does not include homicidal  or suicidal plan.        Cognition and Memory: Cognition and memory normal.        Judgment: Judgment normal.     Comments: Insight intact    Lab Review:  No results found for: NA, K, CL, CO2, GLUCOSE, BUN, CREATININE, CALCIUM, PROT, ALBUMIN, AST, ALT, ALKPHOS, BILITOT, GFRNONAA, GFRAA  No results found for: WBC, RBC, HGB, HCT, PLT, MCV, MCH, MCHC, RDW, LYMPHSABS, MONOABS, EOSABS, BASOSABS  No results found for: POCLITH, LITHIUM   No results found for: PHENYTOIN, PHENOBARB, VALPROATE, CBMZ   .res Assessment: Plan:    Plan:  PDMP reviewed  1. Ritalin 30 mg LA every morning - not taking unless needed 2. Lorazepam 0.5mg  - 3 times daily - not taking 3. Restarting Prozac 20mg  daily- had stopped   RTC 4 months  Patient advised to contact office with any questions, adverse effects, or acute worsening in signs and symptoms.   Diagnoses and all orders for this visit:  Mixed obsessional thoughts and acts -     FLUoxetine (PROZAC) 20 MG capsule; Take 1 capsule (20 mg total) by mouth daily.  Attention deficit hyperactivity disorder (ADHD), combined type, moderate    Please see After Visit Summary for patient specific instructions.  No future appointments.  No orders of the defined types were placed in this encounter.   -------------------------------

## 2020-11-07 ENCOUNTER — Ambulatory Visit: Payer: Self-pay | Admitting: Adult Health

## 2020-11-21 ENCOUNTER — Ambulatory Visit: Payer: Self-pay | Admitting: Adult Health

## 2021-07-11 ENCOUNTER — Ambulatory Visit (INDEPENDENT_AMBULATORY_CARE_PROVIDER_SITE_OTHER): Payer: BLUE CROSS/BLUE SHIELD | Admitting: Adult Health

## 2021-07-11 ENCOUNTER — Encounter: Payer: Self-pay | Admitting: Adult Health

## 2021-07-11 DIAGNOSIS — F810 Specific reading disorder: Secondary | ICD-10-CM | POA: Diagnosis not present

## 2021-07-11 DIAGNOSIS — F422 Mixed obsessional thoughts and acts: Secondary | ICD-10-CM | POA: Diagnosis not present

## 2021-07-11 DIAGNOSIS — F341 Dysthymic disorder: Secondary | ICD-10-CM | POA: Diagnosis not present

## 2021-07-11 DIAGNOSIS — F902 Attention-deficit hyperactivity disorder, combined type: Secondary | ICD-10-CM | POA: Diagnosis not present

## 2021-07-11 MED ORDER — FLUOXETINE HCL 20 MG PO CAPS: 20.0000 mg | ORAL_CAPSULE | Freq: Every day | ORAL | 5 refills | Status: DC

## 2021-07-11 NOTE — Progress Notes (Signed)
Gary Solomon [id number] December 11, 1995 25 y.o.  Subjective:   Patient ID:  Gary Solomon is a 26 y.o. (DOB 07-14-1995) male.  Chief Complaint: No chief complaint on file.   HPI Gary Solomon presents to the office today for follow-up of ADHD, OCD, atypical dysthymia, and reading disorder.  Describes mood today as "ok". Pleasant. Mood symptoms - reports some depression, anxiety, and irritability. Reports some worry and rumination. Reports a few panic attacks. Stating "I'm doing better in some departments and bad in others". Reports taking medications, but not consistently - plans to start setting an alarm. Trying to get into the habit of taking it again. Stable interest and motivation. Taking medications as prescribed.  Energy levels varies. Active, does not have a regular exercise routine. Walking some days. Enjoys some usual interests and activities. Single. Lives at home with mother - 1 cat and 1 dog. Spending time with family. Appetite varies - forgets to eat. Weight stable. Sleep schedule varies. Averages 5 to 6 hours - some troubles getting to sleep. Reports some daytime napping. Focus and concentration difficulties at times. Taking Ritalin when needed. Completing tasks. Managing aspects of household.  Denies SI or HI.  Denies AH or VH.   Previous medication trials: Remeron.       Review of Systems:  Review of Systems  Musculoskeletal:  Negative for gait problem.  Neurological:  Negative for tremors.  Psychiatric/Behavioral:         Please refer to HPI    Medications: I have reviewed the patient's current medications.  Current Outpatient Medications  Medication Sig Dispense Refill   FLUoxetine (PROZAC) 20 MG capsule Take 1 capsule (20 mg total) by mouth daily. 30 capsule 5   LORazepam (ATIVAN) 0.5 MG tablet Take 1 tablet (0.5 mg total) by mouth 3 (three) times daily as needed for anxiety (agitation). 30 tablet 0   methylphenidate (RITALIN LA) 30 MG 24 hr capsule Take  1 capsule (30 mg total) by mouth daily after breakfast. 30 capsule 0   methylphenidate (RITALIN LA) 30 MG 24 hr capsule Take 1 capsule (30 mg total) by mouth daily after breakfast. 30 capsule 0   methylphenidate (RITALIN LA) 30 MG 24 hr capsule Take 1 capsule (30 mg total) by mouth daily after breakfast. 30 capsule 0   polyethylene glycol powder (GLYCOLAX/MIRALAX) powder Take 17 g by mouth daily. Important to take this every day 527 g 12   tretinoin (RETIN-A) 0.01 % gel Apply topically at bedtime. 45 g 5   No current facility-administered medications for this visit.    Medication Side Effects: None  Allergies: No Known Allergies  Past Medical History:  Diagnosis Date   ADHD (attention deficit hyperactivity disorder)     Past Medical History, Surgical history, Social history, and Family history were reviewed and updated as appropriate.   Please see review of systems for further details on the patient's review from today.   Objective:   Physical Exam:  There were no vitals taken for this visit.  Physical Exam Constitutional:      General: He is not in acute distress. Musculoskeletal:        General: No deformity.  Neurological:     Mental Status: He is alert and oriented to person, place, and time.     Coordination: Coordination normal.  Psychiatric:        Attention and Perception: Attention and perception normal. He does not perceive auditory or visual hallucinations.  Mood and Affect: Mood normal. Mood is not anxious or depressed. Affect is not labile, blunt, angry or inappropriate.        Speech: Speech normal.        Behavior: Behavior normal.        Thought Content: Thought content normal. Thought content is not paranoid or delusional. Thought content does not include homicidal or suicidal ideation. Thought content does not include homicidal or suicidal plan.        Cognition and Memory: Cognition and memory normal.        Judgment: Judgment normal.     Comments:  Insight intact     Lab Review:  No results found for: "NA", "K", "CL", "CO2", "GLUCOSE", "BUN", "CREATININE", "CALCIUM", "PROT", "ALBUMIN", "AST", "ALT", "ALKPHOS", "BILITOT", "GFRNONAA", "GFRAA"  No results found for: "WBC", "RBC", "HGB", "HCT", "PLT", "MCV", "MCH", "MCHC", "RDW", "LYMPHSABS", "MONOABS", "EOSABS", "BASOSABS"  No results found for: "POCLITH", "LITHIUM"   No results found for: "PHENYTOIN", "PHENOBARB", "VALPROATE", "CBMZ"   .res Assessment: Plan:    Plan:  PDMP reviewed  1. Ritalin 30 mg LA every morning - not taking unless needed 2. Lorazepam 0.5mg  - 3 times daily - taking intermittently 3. Prozac 20mg  daily  RTC 4 months  Patient advised to contact office with any questions, adverse effects, or acute worsening in signs and symptoms.  Diagnoses and all orders for this visit:  Mixed obsessional thoughts and acts -     FLUoxetine (PROZAC) 20 MG capsule; Take 1 capsule (20 mg total) by mouth daily.     Please see After Visit Summary for patient specific instructions.  No future appointments.  No orders of the defined types were placed in this encounter.   -------------------------------

## 2021-08-23 ENCOUNTER — Ambulatory Visit: Payer: BLUE CROSS/BLUE SHIELD | Admitting: Family Medicine

## 2021-08-23 ENCOUNTER — Encounter: Payer: Self-pay | Admitting: Family Medicine

## 2021-08-23 VITALS — BP 118/78 | HR 74 | Temp 97.4°F | Ht 66.5 in | Wt 169.4 lb

## 2021-08-23 DIAGNOSIS — G255 Other chorea: Secondary | ICD-10-CM | POA: Diagnosis not present

## 2021-08-23 DIAGNOSIS — Z0000 Encounter for general adult medical examination without abnormal findings: Secondary | ICD-10-CM

## 2021-08-23 NOTE — Addendum Note (Signed)
Addended by: Varney Biles on: 08/23/2021 04:18 PM   Modules accepted: Orders

## 2021-08-23 NOTE — Progress Notes (Signed)
New Patient Office Visit  Subjective    Patient ID: Gary Solomon, male    DOB: 29-Jun-1995  Age: 26 y.o. MRN: [id number]  CC:  Chief Complaint  Patient presents with   Establish Care    Loss of control of arms and legs when laying down rarely. Patient is fasting    HPI Gary Solomon presents to establish care With a primary care physician.  Significant past medical history of severe depression with anxiety, attention deficit disorder and borderline autism.  He is followed with behavioral health.  He lives with his mother.  He helps to start back to school at [employer] with their online classes.  He does not drive at this point.  He is interested in Social worker.  He has regular dental care.  He is not exercising regularly at this time.  He is not sexually active.  He does not smoke or drink alcohol.  He does not use illicit drugs.  He describes involuntary movements in his arms and legs at night when he gets into the bed.  They usually resolve after 10 minutes or so.  There is no loss of consciousness bowel or bladder incontinence.  He is totally conscious throughout these events and has no postictal state.  No family history of Huntington's chorea.  He does take methylphenidate.  He has a resting tremor in his hands that resolved with activity.  Outpatient Encounter Medications as of 08/23/2021  Medication Sig   FLUoxetine (PROZAC) 20 MG capsule Take 1 capsule (20 mg total) by mouth daily.   LORazepam (ATIVAN) 0.5 MG tablet Take 1 tablet (0.5 mg total) by mouth 3 (three) times daily as needed for anxiety (agitation). (Patient not taking: Reported on 08/23/2021)   methylphenidate (RITALIN LA) 30 MG 24 hr capsule Take 1 capsule (30 mg total) by mouth daily after breakfast.   methylphenidate (RITALIN LA) 30 MG 24 hr capsule Take 1 capsule (30 mg total) by mouth daily after breakfast.   methylphenidate (RITALIN LA) 30 MG 24 hr capsule Take 1 capsule (30 mg total) by mouth daily after breakfast.    methylphenidate (RITALIN) 20 MG tablet Take by mouth. (Patient not taking: Reported on 08/23/2021)   polyethylene glycol powder (GLYCOLAX/MIRALAX) powder Take 17 g by mouth daily. Important to take this every day (Patient not taking: Reported on 08/23/2021)   tretinoin (RETIN-A) 0.01 % gel Apply topically at bedtime. (Patient not taking: Reported on 08/23/2021)   No facility-administered encounter medications on file as of 08/23/2021.    Past Medical History:  Diagnosis Date   ADHD (attention deficit hyperactivity disorder)     History reviewed. No pertinent surgical history.  Family History  Problem Relation Age of Onset   Depression Mother     Social History   Socioeconomic History   Marital status: Single    Spouse name: Not on file   Number of children: Not on file   Years of education: Not on file   Highest education level: Not on file  Occupational History   Not on file  Tobacco Use   Smoking status: Never   Smokeless tobacco: Never  Vaping Use   Vaping Use: Never used  Substance and Sexual Activity   Alcohol use: No   Drug use: No   Sexual activity: Never  Other Topics Concern   Not on file  Social History Narrative   Not on file   Social Determinants of Health   Financial Resource Strain: Not on  file  Food Insecurity: Not on file  Transportation Needs: Not on file  Physical Activity: Not on file  Stress: Not on file  Social Connections: Not on file  Intimate Partner Violence: Not on file    Review of Systems  Constitutional: Negative.   HENT: Negative.    Eyes:  Negative for blurred vision, discharge and redness.  Respiratory: Negative.    Cardiovascular: Negative.   Gastrointestinal:  Negative for abdominal pain.  Genitourinary: Negative.   Musculoskeletal: Negative.  Negative for myalgias.  Skin:  Negative for rash.  Neurological:  Negative for tingling, loss of consciousness and weakness.  Endo/Heme/Allergies:  Negative for polydipsia.       08/23/2021    4:07 PM  Depression screen PHQ 2/9  Decreased Interest 1  Down, Depressed, Hopeless 2  PHQ - 2 Score 3  Altered sleeping 3  Tired, decreased energy 2  Change in appetite 2  Feeling bad or failure about yourself  3  Trouble concentrating 1  Moving slowly or fidgety/restless 1  Suicidal thoughts 0  PHQ-9 Score 15  Difficult doing work/chores Very difficult         Objective    BP 118/78 (BP Location: Right Arm, Patient Position: Sitting, Cuff Size: Large)   Pulse 74   Temp (!) 97.4 F (36.3 C) (Temporal)   Ht 5' 6.5" (1.689 m)   Wt 169 lb 6.4 oz (76.8 kg)   SpO2 98%   BMI 26.93 kg/m   Physical Exam Constitutional:      General: He is not in acute distress.    Appearance: Normal appearance. He is not ill-appearing, toxic-appearing or diaphoretic.  HENT:     Head: Normocephalic and atraumatic.     Right Ear: External ear normal.     Left Ear: External ear normal.     Mouth/Throat:     Mouth: Mucous membranes are moist.     Pharynx: Oropharynx is clear. No oropharyngeal exudate or posterior oropharyngeal erythema.  Eyes:     General: No scleral icterus.       Right eye: No discharge.        Left eye: No discharge.     Extraocular Movements: Extraocular movements intact.     Conjunctiva/sclera: Conjunctivae normal.     Pupils: Pupils are equal, round, and reactive to light.  Cardiovascular:     Rate and Rhythm: Normal rate and regular rhythm.  Pulmonary:     Effort: Pulmonary effort is normal. No respiratory distress.     Breath sounds: Normal breath sounds.  Abdominal:     General: Bowel sounds are normal.     Tenderness: There is no guarding.  Genitourinary:    Comments: Patient declined Musculoskeletal:     Cervical back: No rigidity or tenderness.  Skin:    General: Skin is warm and dry.  Neurological:     Mental Status: He is alert and oriented to person, place, and time.     Cranial Nerves: No cranial nerve deficit, dysarthria or facial  asymmetry.     Motor: Tremor present.     Coordination: Romberg sign negative.  Psychiatric:        Mood and Affect: Mood normal.        Behavior: Behavior normal.         Assessment & Plan:   Problem List Items Addressed This Visit       Other   Chorea   Other Visit Diagnoses     Healthcare maintenance    -  Primary   Relevant Orders   CBC   Comprehensive metabolic panel   Lipid panel   Urinalysis, Routine w reflex microscopic       Return in about 1 year (around 08/24/2022), or if symptoms worsen or fail to improve.  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  He will check with behavioral health.  Will follow this along with him.  Encouraged regular exercise by walking 30 minutes 5 days weekly.  Information was given on health maintenance.  Mliss Sax, MD

## 2021-08-24 ENCOUNTER — Telehealth: Payer: Self-pay

## 2021-08-24 DIAGNOSIS — E875 Hyperkalemia: Secondary | ICD-10-CM

## 2021-08-24 LAB — CBC
HCT: 47.1 % (ref 39.0–52.0)
Hemoglobin: 15.6 g/dL (ref 13.0–17.0)
MCHC: 33 g/dL (ref 30.0–36.0)
MCV: 84.3 fl (ref 78.0–100.0)
Platelets: 285 10*3/uL (ref 150.0–400.0)
RBC: 5.59 Mil/uL (ref 4.22–5.81)
RDW: 12.8 % (ref 11.5–15.5)
WBC: 7.3 10*3/uL (ref 4.0–10.5)

## 2021-08-24 NOTE — Telephone Encounter (Signed)
Critical Value:   [facility] @ [facility]   Elevated potassium 20.70   Calcium: -0.50   Per Dr. Doreene Burke Pt has been contacted to return for a redraw to ensure that values are correct.   Pt will come Monday Morning fasting at 8:30 am

## 2021-08-24 NOTE — Progress Notes (Unsigned)
Per orders of ***, pt is here for ***. pt received *** in *** at ***. Given by Lear Carstens L. CMA/CPT. Pt tolerated *** well.   

## 2021-08-27 ENCOUNTER — Ambulatory Visit: Payer: BLUE CROSS/BLUE SHIELD

## 2021-08-27 ENCOUNTER — Other Ambulatory Visit (INDEPENDENT_AMBULATORY_CARE_PROVIDER_SITE_OTHER): Payer: BLUE CROSS/BLUE SHIELD

## 2021-08-27 DIAGNOSIS — Z0000 Encounter for general adult medical examination without abnormal findings: Secondary | ICD-10-CM

## 2021-08-27 DIAGNOSIS — Z23 Encounter for immunization: Secondary | ICD-10-CM | POA: Diagnosis not present

## 2021-08-27 LAB — LIPID PANEL
Cholesterol: 181 mg/dL (ref 0–200)
HDL: 47.3 mg/dL (ref >39.00)
LDL Cholesterol: 123 mg/dL — ABNORMAL HIGH (ref 0–99)
NonHDL: 133.44
Total CHOL/HDL Ratio: 4
Triglycerides: 52 mg/dL (ref 0.0–149.0)
VLDL: 10.4 mg/dL (ref 0.0–40.0)

## 2021-08-27 LAB — COMPREHENSIVE METABOLIC PANEL
ALT: 34 U/L (ref 0–53)
AST: 28 U/L (ref 0–37)
Albumin: 4.9 g/dL (ref 3.5–5.2)
Alkaline Phosphatase: 70 U/L (ref 39–117)
BUN: 17 mg/dL (ref 6–23)
CO2: 26 mEq/L (ref 19–32)
Calcium: 10.2 mg/dL (ref 8.4–10.5)
Chloride: 102 mEq/L (ref 96–112)
Creatinine, Ser: 1.16 mg/dL (ref 0.40–1.50)
GFR: 87.81 mL/min (ref >60.00)
Glucose, Bld: 95 mg/dL (ref 70–99)
Potassium: 4 mEq/L (ref 3.5–5.1)
Sodium: 140 mEq/L (ref 135–145)
Total Bilirubin: 0.5 mg/dL (ref 0.2–1.2)
Total Protein: 7.6 g/dL (ref 6.0–8.3)

## 2021-08-27 NOTE — Addendum Note (Signed)
Addended by: Varney Biles on: 08/27/2021 08:29 AM   Modules accepted: Orders

## 2021-08-28 NOTE — Progress Notes (Signed)
Per orders of Dr. Doreene Burke ,pt is here for Tdap vaccine . Pt received injection in right deltoid at 8:30 am, pt tolerated injection well. Injection given by Wynema Birch. CMA.

## 2021-10-02 ENCOUNTER — Encounter: Payer: Self-pay | Admitting: Family Medicine

## 2021-10-02 ENCOUNTER — Ambulatory Visit: Payer: BLUE CROSS/BLUE SHIELD | Admitting: Family Medicine

## 2021-10-02 VITALS — BP 110/68 | HR 87 | Temp 97.5°F | Ht 66.0 in | Wt 172.2 lb

## 2021-10-02 DIAGNOSIS — J029 Acute pharyngitis, unspecified: Secondary | ICD-10-CM

## 2021-10-02 LAB — POC COVID19 BINAXNOW: SARS Coronavirus 2 Ag: NEGATIVE

## 2021-10-02 NOTE — Progress Notes (Signed)
Established Patient Office Visit  Subjective   Patient ID: Gary Solomon, male    DOB: 14-May-1995  Age: 26 y.o. MRN: [id number]  Chief Complaint  Patient presents with   Sore Throat    Sore throat and chest pains x 3 days.     Sore Throat  Associated symptoms include coughing. Pertinent negatives include no abdominal pain, congestion, headaches or shortness of breath.   presents with a 2-day history of sore throat with slight cough, pain in his chest and myalgias in his back.  He denies headaches, fevers chills nausea vomiting.    Review of Systems  Constitutional: Negative.  Negative for chills and fever.  HENT:  Positive for sore throat. Negative for congestion.   Eyes:  Negative for blurred vision, discharge and redness.  Respiratory:  Positive for cough. Negative for sputum production, shortness of breath and wheezing.   Cardiovascular: Negative.   Gastrointestinal:  Negative for abdominal pain.  Genitourinary: Negative.   Musculoskeletal:  Positive for myalgias.  Skin:  Negative for rash.  Neurological:  Negative for dizziness, tingling, loss of consciousness, weakness and headaches.  Endo/Heme/Allergies:  Negative for polydipsia.      Objective:     BP 110/68 (BP Location: Right Arm, Patient Position: Sitting, Cuff Size: Normal)   Pulse 87   Temp (!) 97.5 F (36.4 C) (Temporal)   Ht 5\' 6"  (1.676 m)   Wt 172 lb 3.2 oz (78.1 kg)   SpO2 97%   BMI 27.79 kg/m    Physical Exam Constitutional:      General: He is not in acute distress.    Appearance: Normal appearance. He is not ill-appearing, toxic-appearing or diaphoretic.  HENT:     Head: Normocephalic and atraumatic.     Right Ear: Tympanic membrane, ear canal and external ear normal.     Left Ear: Tympanic membrane, ear canal and external ear normal.     Mouth/Throat:     Mouth: Mucous membranes are moist.     Pharynx: Oropharynx is clear. Uvula midline. No oropharyngeal exudate or posterior  oropharyngeal erythema.     Tonsils: No tonsillar exudate or tonsillar abscesses. 1+ on the right. 1+ on the left.  Eyes:     General: No scleral icterus.       Right eye: No discharge.        Left eye: No discharge.     Extraocular Movements: Extraocular movements intact.     Conjunctiva/sclera: Conjunctivae normal.     Pupils: Pupils are equal, round, and reactive to light.  Cardiovascular:     Rate and Rhythm: Normal rate and regular rhythm.  Pulmonary:     Effort: Pulmonary effort is normal. No respiratory distress.     Breath sounds: Normal breath sounds. No wheezing or rales.  Abdominal:     General: Bowel sounds are normal.     Tenderness: There is no abdominal tenderness. There is no guarding.  Musculoskeletal:     Cervical back: No rigidity or tenderness.  Lymphadenopathy:     Cervical: No cervical adenopathy.  Skin:    General: Skin is warm and dry.  Neurological:     Mental Status: He is alert and oriented to person, place, and time.  Psychiatric:        Mood and Affect: Mood normal.        Behavior: Behavior normal.      Results for orders placed or performed in visit on 10/02/21  POC COVID-19  Result  Value Ref Range   SARS Coronavirus 2 Ag Negative Negative      The ASCVD Risk score (Arnett DK, et al., 2019) failed to calculate for the following reasons:   The 2019 ASCVD risk score is only valid for ages 70 to 100    Assessment & Plan:   Problem List Items Addressed This Visit   None Visit Diagnoses     Sore throat    -  Primary   Relevant Orders   POC COVID-19 (Completed)       Return if symptoms worsen or fail to improve in 1 week..  Recommended Tylenol as needed and salt water gargles.  Information on viral illnesses was given.  Mliss Sax, MD

## 2022-01-10 ENCOUNTER — Encounter: Payer: Self-pay | Admitting: Adult Health

## 2022-01-10 ENCOUNTER — Ambulatory Visit (INDEPENDENT_AMBULATORY_CARE_PROVIDER_SITE_OTHER): Payer: BLUE CROSS/BLUE SHIELD | Admitting: Adult Health

## 2022-01-10 DIAGNOSIS — F422 Mixed obsessional thoughts and acts: Secondary | ICD-10-CM | POA: Diagnosis not present

## 2022-01-10 DIAGNOSIS — F810 Specific reading disorder: Secondary | ICD-10-CM | POA: Diagnosis not present

## 2022-01-10 DIAGNOSIS — F902 Attention-deficit hyperactivity disorder, combined type: Secondary | ICD-10-CM

## 2022-01-10 DIAGNOSIS — F341 Dysthymic disorder: Secondary | ICD-10-CM

## 2022-01-10 MED ORDER — FLUOXETINE HCL 10 MG PO CAPS: ORAL_CAPSULE | ORAL | 5 refills | Status: DC

## 2022-01-10 NOTE — Progress Notes (Signed)
KAVAN KIERAN [id number] January 01, 1996 26 y.o.  Subjective:   Patient ID:  Gary Solomon is a 26 y.o. (DOB [birth year]) male.  Chief Complaint: No chief complaint on file.   HPI BRILEE BETHANCOURT presents to the office today for follow-up of ADHD, OCD, atypical dysthymia, and reading disorder.  Describes mood today as "ok". Pleasant. Mood symptoms - reports some depression - sad "occasionally". Stating "I feel like a failure sometimes". Feels anxious - "all the time". Having "mini" panic attacks when riding in the car. Denies irritability. Reports some worry, rumination and over thinking. Stating "I'm in the middle - drifting towards not ok - but ok generally". Reports taking medications consistently. Stable interest and motivation. Taking medications as prescribed.  Energy levels random. Active, does not have a regular exercise routine. Walking some days - "trying to get better at it". Enjoys some usual interests and activities. Single. Lives at home with mother - 1 cat and 1 dog. Spending time with family. Appetite varies - forgets to eat. Weight stable. Sleep schedule varies - using a sleep tracker - averages 4 to 6 hours. Taking Melatonin nightly. Denies daytime napping. Focus and concentration difficulties at times. Taking Ritalin when needed. Completing tasks. Managing aspects of household.  Denies SI or HI.  Denies AH or VH. Denies self harm. Denies substance use.   Previous medication trials: Remeron.    GAD-7    Flowsheet Row Office Visit from 08/23/2021 in [facility]  Total GAD-7 Score 14      PHQ2-9    [facility] Visit from 10/02/2021 in [facility] Visit from 08/23/2021 in [facility]  PHQ-2 Total Score 0 3  PHQ-9 Total Score -- 15        Review of Systems:  Review of Systems  Musculoskeletal:  Negative for gait problem.  Neurological:  Negative for tremors.   Psychiatric/Behavioral:         Please refer to HPI    Medications: I have reviewed the patient's current medications.  Current Outpatient Medications  Medication Sig Dispense Refill   FLUoxetine (PROZAC) 10 MG capsule Take 3 capsules daily (30mg ). 90 capsule 5   LORazepam (ATIVAN) 0.5 MG tablet Take 1 tablet (0.5 mg total) by mouth 3 (three) times daily as needed for anxiety (agitation). (Patient not taking: Reported on 08/23/2021) 30 tablet 0   methylphenidate (RITALIN LA) 30 MG 24 hr capsule Take 1 capsule (30 mg total) by mouth daily after breakfast. 30 capsule 0   methylphenidate (RITALIN LA) 30 MG 24 hr capsule Take 1 capsule (30 mg total) by mouth daily after breakfast. 30 capsule 0   methylphenidate (RITALIN LA) 30 MG 24 hr capsule Take 1 capsule (30 mg total) by mouth daily after breakfast. 30 capsule 0   No current facility-administered medications for this visit.    Medication Side Effects: None  Allergies: No Known Allergies  Past Medical History:  Diagnosis Date   ADHD (attention deficit hyperactivity disorder)     Past Medical History, Surgical history, Social history, and Family history were reviewed and updated as appropriate.   Please see review of systems for further details on the patient's review from today.   Objective:   Physical Exam:  There were no vitals taken for this visit.  Physical Exam Constitutional:      General: He is not in acute distress. Musculoskeletal:        General: No deformity.  Neurological:  Mental Status: He is alert and oriented to person, place, and time.     Coordination: Coordination normal.  Psychiatric:        Attention and Perception: Attention and perception normal. He does not perceive auditory or visual hallucinations.        Mood and Affect: Mood normal. Mood is not anxious or depressed. Affect is not labile, blunt, angry or inappropriate.        Speech: Speech normal.        Behavior: Behavior normal.         Thought Content: Thought content normal. Thought content is not paranoid or delusional. Thought content does not include homicidal or suicidal ideation. Thought content does not include homicidal or suicidal plan.        Cognition and Memory: Cognition and memory normal.        Judgment: Judgment normal.     Comments: Insight intact     Lab Review:     Component Value Date/Time   NA 140 08/27/2021 0829   K 4.0 08/27/2021 0829   CL 102 08/27/2021 0829   CO2 26 08/27/2021 0829   GLUCOSE 95 08/27/2021 0829   BUN 17 08/27/2021 0829   CREATININE 1.16 08/27/2021 0829   CALCIUM 10.2 08/27/2021 0829   PROT 7.6 08/27/2021 0829   ALBUMIN 4.9 08/27/2021 0829   AST 28 08/27/2021 0829   ALT 34 08/27/2021 0829   ALKPHOS 70 08/27/2021 0829   BILITOT 0.5 08/27/2021 0829       Component Value Date/Time   WBC 7.3 08/23/2021 1612   RBC 5.59 08/23/2021 1612   HGB 15.6 08/23/2021 1612   HCT 47.1 08/23/2021 1612   PLT 285.0 08/23/2021 1612   MCV 84.3 08/23/2021 1612   MCHC 33.0 08/23/2021 1612   RDW 12.8 08/23/2021 1612    No results found for: "POCLITH", "LITHIUM"   No results found for: "PHENYTOIN", "PHENOBARB", "VALPROATE", "CBMZ"   .res Assessment: Plan:    Plan:  PDMP reviewed  1. Ritalin 30 mg LA every morning - not taking unless needed 2. Lorazepam 0.5mg  - 3 times daily - taking intermittently 3. Increase Prozac 20mg  to 30mg  daily  RTC 4 months  Patient advised to contact office with any questions, adverse effects, or acute worsening in signs and symptoms.  Diagnoses and all orders for this visit:  Persistent depressive disorder with atypical features, currently moderate  Attention deficit hyperactivity disorder (ADHD), combined type, moderate  Specific learning disorder, with impairment in reading, severe  Mixed obsessional thoughts and acts -     FLUoxetine (PROZAC) 10 MG capsule; Take 3 capsules daily (30mg ).     Please see After Visit Summary for patient  specific instructions.  No future appointments.   No orders of the defined types were placed in this encounter.   -------------------------------

## 2022-02-12 ENCOUNTER — Other Ambulatory Visit: Payer: Self-pay

## 2022-02-12 ENCOUNTER — Telehealth: Payer: Self-pay | Admitting: Adult Health

## 2022-02-12 MED ORDER — CLONIDINE HCL 0.1 MG PO TABS: 0.1000 mg | ORAL_TABLET | Freq: Every day | ORAL | 0 refills | Status: DC

## 2022-02-12 NOTE — Telephone Encounter (Signed)
Let's try Clonidine 0.1mg  at hs for sleep.

## 2022-02-12 NOTE — Telephone Encounter (Signed)
Pt has decided he does need med to help with  sleep. Requesting to send Rx to [facility]. Apt 4/25

## 2022-02-12 NOTE — Telephone Encounter (Signed)
Rx sent 

## 2022-02-12 NOTE — Telephone Encounter (Signed)
Pt stated he is not able to fall asleep.He lays down at 10 pm but is awake until 4 am.He wants something to help him fall asleep

## 2022-02-20 ENCOUNTER — Telehealth: Payer: Self-pay | Admitting: Adult Health

## 2022-02-20 ENCOUNTER — Other Ambulatory Visit: Payer: Self-pay

## 2022-02-20 DIAGNOSIS — F422 Mixed obsessional thoughts and acts: Secondary | ICD-10-CM

## 2022-02-20 MED ORDER — FLUOXETINE HCL 10 MG PO CAPS: ORAL_CAPSULE | ORAL | 5 refills | Status: DC

## 2022-02-20 NOTE — Telephone Encounter (Signed)
Next visit is 05/09/22. Sharyn Lull, Gary Solomon's mom, called. She is listed on his DPR. [facility] on [address] in [place] called him & said that we need to rewrite the RX for Fluoxetine. Kelsey is currently taking Fluoxetine 10 mg three capsules per day but the pharmacy needs it to be rewritten to show Fluoxetine 1 capsule for 10 mg and one for 20 mg.

## 2022-02-20 NOTE — Telephone Encounter (Signed)
Rx sent 

## 2022-03-12 ENCOUNTER — Other Ambulatory Visit: Payer: Self-pay | Admitting: Adult Health

## 2022-03-13 ENCOUNTER — Other Ambulatory Visit: Payer: Self-pay | Admitting: Adult Health

## 2022-03-13 DIAGNOSIS — F422 Mixed obsessional thoughts and acts: Secondary | ICD-10-CM

## 2022-03-13 MED ORDER — FLUOXETINE HCL 10 MG PO CAPS: ORAL_CAPSULE | ORAL | 1 refills | Status: DC

## 2022-03-13 MED ORDER — FLUOXETINE HCL 20 MG PO CAPS: 20.0000 mg | ORAL_CAPSULE | Freq: Every day | ORAL | 1 refills | Status: DC

## 2022-03-14 ENCOUNTER — Telehealth: Payer: Self-pay | Admitting: Adult Health

## 2022-03-14 NOTE — Telephone Encounter (Signed)
Next visit is 4/25.Gary Solomon, mom called regarding a prescription Gary Solomon is on. She would like someone to call her at [phone].

## 2022-03-14 NOTE — Telephone Encounter (Signed)
Prescriptions were sent yesterday. Mom aware.

## 2022-05-07 ENCOUNTER — Telehealth: Payer: Self-pay

## 2022-05-09 ENCOUNTER — Ambulatory Visit (INDEPENDENT_AMBULATORY_CARE_PROVIDER_SITE_OTHER): Payer: Self-pay | Admitting: Adult Health

## 2022-05-09 ENCOUNTER — Encounter: Payer: Self-pay | Admitting: Adult Health

## 2022-05-09 DIAGNOSIS — F341 Dysthymic disorder: Secondary | ICD-10-CM

## 2022-05-09 DIAGNOSIS — F422 Mixed obsessional thoughts and acts: Secondary | ICD-10-CM

## 2022-05-09 DIAGNOSIS — F902 Attention-deficit hyperactivity disorder, combined type: Secondary | ICD-10-CM

## 2022-05-09 DIAGNOSIS — F810 Specific reading disorder: Secondary | ICD-10-CM

## 2022-05-09 NOTE — Progress Notes (Signed)
ADAR RASE [id number] 1995-09-03 26 y.o.  Subjective:   Patient ID:  Gary Solomon is a 27 y.o. (DOB December 26, 1995) male.  Chief Complaint: No chief complaint on file.   HPI Gary Solomon presents to the office today for follow-up of ADHD, OCD, atypical dysthymia, and reading disorder.  Describes mood today as "ok". Pleasant. Denies tearfulness. Mood symptoms - reports some depression. Denies irritability. Feels more anxious overall. Denies recent panic attacks. Reports some worry, rumination and over thinking. Reports difficulties being out in public - gatherings. Mood is consistent - occasional ups and downs. Stating "I'm doing ok enough". Stable interest and motivation. Taking medications as prescribed.  Energy levels varies. Active, does not have a regular exercise routine. Walking some days. Enjoys some usual interests and activities. Single. Lives at home with mother, brother - 1 cat and 1 dog. Spending time with family. Appetite varies. Weight gain. Sleep schedule varies. Averages 3 to 5 hours. Reports daytime napping. Reports focus and concentration difficulties. Taking Ritalin when needed. Completing tasks. Managing aspects of household.  Denies SI or HI.  Denies AH or VH. Denies self harm. Denies substance use.  Previous medication trials: Remeron.    GAD-7    Flowsheet Row Office Visit from 08/23/2021 in [facility]  Total GAD-7 Score 14      PHQ2-9    Flowsheet Row Office Visit from 10/02/2021 in [facility] Visit from 08/23/2021 in [facility] Total Score 0 3  PHQ-9 Total Score -- 15        Review of Systems:  Review of Systems  Musculoskeletal:  Negative for gait problem.  Neurological:  Negative for tremors.  Psychiatric/Behavioral:         Please refer to HPI    Medications: I have reviewed the patient's current  medications.  Current Outpatient Medications  Medication Sig Dispense Refill   FLUoxetine (PROZAC) 10 MG capsule Take 1 capsule along with a 20 mg capsule to = 30 mg. 90 capsule 1   FLUoxetine (PROZAC) 20 MG capsule Take 1 capsule (20 mg total) by mouth daily. 90 capsule 1   LORazepam (ATIVAN) 0.5 MG tablet Take 1 tablet (0.5 mg total) by mouth 3 (three) times daily as needed for anxiety (agitation). (Patient not taking: Reported on 08/23/2021) 30 tablet 0   methylphenidate (RITALIN LA) 30 MG 24 hr capsule Take 1 capsule (30 mg total) by mouth daily after breakfast. 30 capsule 0   methylphenidate (RITALIN LA) 30 MG 24 hr capsule Take 1 capsule (30 mg total) by mouth daily after breakfast. 30 capsule 0   methylphenidate (RITALIN LA) 30 MG 24 hr capsule Take 1 capsule (30 mg total) by mouth daily after breakfast. 30 capsule 0   No current facility-administered medications for this visit.    Medication Side Effects: None  Allergies: No Known Allergies  Past Medical History:  Diagnosis Date   ADHD (attention deficit hyperactivity disorder)     Past Medical History, Surgical history, Social history, and Family history were reviewed and updated as appropriate.   Please see review of systems for further details on the patient's review from today.   Objective:   Physical Exam:  There were no vitals taken for this visit.  Physical Exam Constitutional:      General: He is not in acute distress. Musculoskeletal:        General: No deformity.  Neurological:     Mental Status: He is alert and oriented to person, place, and time.     Coordination: Coordination normal.  Psychiatric:        Attention and Perception: Attention and perception normal. He does not perceive auditory or visual hallucinations.        Mood and Affect: Mood normal. Mood is not anxious or depressed. Affect is not labile, blunt, angry or inappropriate.        Speech: Speech normal.        Behavior: Behavior normal.         Thought Content: Thought content normal. Thought content is not paranoid or delusional. Thought content does not include homicidal or suicidal ideation. Thought content does not include homicidal or suicidal plan.        Cognition and Memory: Cognition and memory normal.        Judgment: Judgment normal.     Comments: Insight intact     Lab Review:     Component Value Date/Time   NA 140 08/27/2021 0829   K 4.0 08/27/2021 0829   CL 102 08/27/2021 0829   CO2 26 08/27/2021 0829   GLUCOSE 95 08/27/2021 0829   BUN 17 08/27/2021 0829   CREATININE 1.16 08/27/2021 0829   CALCIUM 10.2 08/27/2021 0829   PROT 7.6 08/27/2021 0829   ALBUMIN 4.9 08/27/2021 0829   AST 28 08/27/2021 0829   ALT 34 08/27/2021 0829   ALKPHOS 70 08/27/2021 0829   BILITOT 0.5 08/27/2021 0829       Component Value Date/Time   WBC 7.3 08/23/2021 1612   RBC 5.59 08/23/2021 1612   HGB 15.6 08/23/2021 1612   HCT 47.1 08/23/2021 1612   PLT 285.0 08/23/2021 1612   MCV 84.3 08/23/2021 1612   MCHC 33.0 08/23/2021 1612   RDW 12.8 08/23/2021 1612    No results found for: "POCLITH", "LITHIUM"   No results found for: "PHENYTOIN", "PHENOBARB", "VALPROATE", "CBMZ"   .res Assessment: Plan:    Plan:  PDMP reviewed  D/C Clonidine 0.1mg  - ineffective Ritalin 30 mg LA every morning - not taking unless needed Lorazepam 0.5mg  - 3 times daily - taking intermittently Prozac  daily.  RTC 3 months  Patient advised to contact office with any questions, adverse effects, or acute worsening in signs and symptoms.  Diagnoses and all orders for this visit:  Persistent depressive disorder with atypical features, currently moderate  Attention deficit hyperactivity disorder (ADHD), combined type, moderate  Specific learning disorder, with impairment in reading, severe  Mixed obsessional thoughts and acts     Please see After Visit Summary for patient specific instructions.  No future appointments.   No  orders of the defined types were placed in this encounter.   -------------------------------

## 2022-08-01 ENCOUNTER — Ambulatory Visit (INDEPENDENT_AMBULATORY_CARE_PROVIDER_SITE_OTHER): Payer: Self-pay | Admitting: Adult Health

## 2022-08-01 ENCOUNTER — Encounter: Payer: Self-pay | Admitting: Adult Health

## 2022-08-01 DIAGNOSIS — F341 Dysthymic disorder: Secondary | ICD-10-CM

## 2022-08-01 DIAGNOSIS — F422 Mixed obsessional thoughts and acts: Secondary | ICD-10-CM

## 2022-08-01 DIAGNOSIS — F902 Attention-deficit hyperactivity disorder, combined type: Secondary | ICD-10-CM

## 2022-08-01 DIAGNOSIS — F810 Specific reading disorder: Secondary | ICD-10-CM

## 2022-08-01 MED ORDER — FLUOXETINE HCL 20 MG PO CAPS: 20.0000 mg | ORAL_CAPSULE | Freq: Every day | ORAL | 1 refills | Status: DC

## 2022-08-01 MED ORDER — LORAZEPAM 0.5 MG PO TABS: 0.5000 mg | ORAL_TABLET | Freq: Three times a day (TID) | ORAL | 0 refills | Status: DC | PRN

## 2022-08-01 MED ORDER — FLUOXETINE HCL 10 MG PO CAPS: ORAL_CAPSULE | ORAL | 1 refills | Status: DC

## 2022-08-01 NOTE — Progress Notes (Signed)
GAIUS ISHAQ [id number] December 21, 1995 26 y.o.  Subjective:   Patient ID:  Gary Solomon is a 27 y.o. (DOB 14-Sep-1995) male.  Chief Complaint: No chief complaint on file.   HPI Gary Solomon presents to the office today for follow-up of ADHD, OCD, atypical dysthymia, and reading disorder.  Describes mood today as "ok". Pleasant. Denies tearfulness. Mood symptoms - reports some depression - "I'm used to it - I don't feel good, but can't put my finger on it". Reports irritability. Reports anxiety - "constantly". Denies recent panic attacks. Reports worry, rumination and over thinking. Reports difficulties being out in public - gatherings. Mood is variable. Stating "I'm doing okish". Stable interest and motivation. Taking medications as prescribed.  Energy levels varies. Active, does not have a regular exercise routine. Walking some days. Enjoys some usual interests and activities. Single. Lives at home with mother, brother - snake - 1 cat and 1 dog. Spending time with family. Appetite varies. Weight loss. Sleep schedule varies. Averages 6 hours. Reports daytime napping. Reports focus and concentration difficulties. Taking Ritalin when needed. Completing tasks. Managing aspects of household.  Denies SI or HI.  Denies AH or VH. Denies self harm. Denies substance use.  Previous medication trials: Remeron.    GAD-7    Flowsheet Row Office Visit from 08/23/2021 in [facility]  Total GAD-7 Score 14      PHQ2-9    Flowsheet Row Office Visit from 10/02/2021 in [facility] Visit from 08/23/2021 in [facility] Total Score 0 3  PHQ-9 Total Score -- 15        Review of Systems:  Review of Systems  Musculoskeletal:  Negative for gait problem.  Neurological:  Negative for tremors.  Psychiatric/Behavioral:         Please refer to HPI    Medications:  I have reviewed the patient's current medications.  Current Outpatient Medications  Medication Sig Dispense Refill   FLUoxetine (PROZAC) 10 MG capsule Take 1 capsule along with a 20 mg capsule to = 30 mg. 90 capsule 1   FLUoxetine (PROZAC) 20 MG capsule Take 1 capsule (20 mg total) by mouth daily. 90 capsule 1   LORazepam (ATIVAN) 0.5 MG tablet Take 1 tablet (0.5 mg total) by mouth 3 (three) times daily as needed for anxiety (agitation). 30 tablet 0   methylphenidate (RITALIN LA) 30 MG 24 hr capsule Take 1 capsule (30 mg total) by mouth daily after breakfast. 30 capsule 0   methylphenidate (RITALIN LA) 30 MG 24 hr capsule Take 1 capsule (30 mg total) by mouth daily after breakfast. 30 capsule 0   methylphenidate (RITALIN LA) 30 MG 24 hr capsule Take 1 capsule (30 mg total) by mouth daily after breakfast. 30 capsule 0   No current facility-administered medications for this visit.    Medication Side Effects: None  Allergies: No Known Allergies  Past Medical History:  Diagnosis Date   ADHD (attention deficit hyperactivity disorder)     Past Medical History, Surgical history, Social history, and Family history were reviewed and updated as appropriate.   Please see review of systems for further details on the patient's review from today.   Objective:   Physical Exam:  There were no vitals taken for this visit.  Physical Exam Constitutional:      General: He is not in acute distress. Musculoskeletal:  General: No deformity.  Neurological:     Mental Status: He is alert and oriented to person, place, and time.     Coordination: Coordination normal.  Psychiatric:        Attention and Perception: Attention and perception normal. He does not perceive auditory or visual hallucinations.        Mood and Affect: Mood normal. Mood is not anxious or depressed. Affect is not labile, blunt, angry or inappropriate.        Speech: Speech normal.        Behavior: Behavior normal.         Thought Content: Thought content normal. Thought content is not paranoid or delusional. Thought content does not include homicidal or suicidal ideation. Thought content does not include homicidal or suicidal plan.        Cognition and Memory: Cognition and memory normal.        Judgment: Judgment normal.     Comments: Insight intact     Lab Review:     Component Value Date/Time   NA 140 08/27/2021 0829   K 4.0 08/27/2021 0829   CL 102 08/27/2021 0829   CO2 26 08/27/2021 0829   GLUCOSE 95 08/27/2021 0829   BUN 17 08/27/2021 0829   CREATININE 1.16 08/27/2021 0829   CALCIUM 10.2 08/27/2021 0829   PROT 7.6 08/27/2021 0829   ALBUMIN 4.9 08/27/2021 0829   AST 28 08/27/2021 0829   ALT 34 08/27/2021 0829   ALKPHOS 70 08/27/2021 0829   BILITOT 0.5 08/27/2021 0829       Component Value Date/Time   WBC 7.3 08/23/2021 1612   RBC 5.59 08/23/2021 1612   HGB 15.6 08/23/2021 1612   HCT 47.1 08/23/2021 1612   PLT 285.0 08/23/2021 1612   MCV 84.3 08/23/2021 1612   MCHC 33.0 08/23/2021 1612   RDW 12.8 08/23/2021 1612    No results found for: "POCLITH", "LITHIUM"   No results found for: "PHENYTOIN", "PHENOBARB", "VALPROATE", "CBMZ"   .res Assessment: Plan:    Plan:  PDMP reviewed  Ritalin 30 mg LA every morning - not taking unless needed Lorazepam 0.5mg  - 3 times daily - taking intermittently Prozac 30mg  daily.  RTC 3 months  Patient advised to contact office with any questions, adverse effects, or acute worsening in signs and symptoms.  Discussed potential benefits, risk, and side effects of benzodiazepines to include potential risk of tolerance and dependence, as well as possible drowsiness.  Advised patient not to drive if experiencing drowsiness and to take lowest possible effective dose to minimize risk of dependence and tolerance.   Discussed potential benefits, risks, and side effects of stimulants with patient to include increased heart rate, palpitations, insomnia,  increased anxiety, increased irritability, or decreased appetite.  Instructed patient to contact office if experiencing any significant tolerability issues.   Diagnoses and all orders for this visit:  Persistent depressive disorder with atypical features, currently moderate  Mixed obsessional thoughts and acts -     LORazepam (ATIVAN) 0.5 MG tablet; Take 1 tablet (0.5 mg total) by mouth 3 (three) times daily as needed for anxiety (agitation). -     FLUoxetine (PROZAC) 10 MG capsule; Take 1 capsule along with a 20 mg capsule to = 30 mg.  Attention deficit hyperactivity disorder (ADHD), combined type, moderate  Specific learning disorder, with impairment in reading, severe  Other orders -     FLUoxetine (PROZAC) 20 MG capsule; Take 1 capsule (20 mg total) by mouth daily.  Please see After Visit Summary for patient specific instructions.  No future appointments.   No orders of the defined types were placed in this encounter.   -------------------------------

## 2022-08-10 NOTE — Telephone Encounter (Signed)
error 

## 2022-09-03 NOTE — Telephone Encounter (Signed)
Error

## 2022-11-07 ENCOUNTER — Ambulatory Visit (INDEPENDENT_AMBULATORY_CARE_PROVIDER_SITE_OTHER): Payer: Self-pay | Admitting: Adult Health

## 2022-11-07 DIAGNOSIS — Z0389 Encounter for observation for other suspected diseases and conditions ruled out: Secondary | ICD-10-CM

## 2022-11-07 NOTE — Progress Notes (Signed)
Patient no show appointment. ? ?

## 2023-02-17 ENCOUNTER — Other Ambulatory Visit: Payer: Self-pay | Admitting: Adult Health

## 2023-02-17 DIAGNOSIS — F422 Mixed obsessional thoughts and acts: Secondary | ICD-10-CM

## 2023-02-17 NOTE — Telephone Encounter (Signed)
Please schedule pt a fu lv 07/18 ns 10/24

## 2023-03-05 NOTE — Telephone Encounter (Signed)
Pt has appt on 3/3

## 2023-03-17 ENCOUNTER — Telehealth: Payer: Self-pay | Admitting: Adult Health

## 2023-03-25 ENCOUNTER — Encounter: Payer: Self-pay | Admitting: Adult Health

## 2023-03-25 ENCOUNTER — Telehealth: Payer: Self-pay | Admitting: Adult Health

## 2023-03-25 DIAGNOSIS — F429 Obsessive-compulsive disorder, unspecified: Secondary | ICD-10-CM

## 2023-03-25 DIAGNOSIS — F422 Mixed obsessional thoughts and acts: Secondary | ICD-10-CM

## 2023-03-25 DIAGNOSIS — F810 Specific reading disorder: Secondary | ICD-10-CM | POA: Diagnosis not present

## 2023-03-25 DIAGNOSIS — F909 Attention-deficit hyperactivity disorder, unspecified type: Secondary | ICD-10-CM

## 2023-03-25 DIAGNOSIS — F341 Dysthymic disorder: Secondary | ICD-10-CM

## 2023-03-25 DIAGNOSIS — F902 Attention-deficit hyperactivity disorder, combined type: Secondary | ICD-10-CM

## 2023-03-25 MED ORDER — FLUOXETINE HCL 10 MG PO CAPS
ORAL_CAPSULE | ORAL | 1 refills | Status: AC
Start: 2023-03-25 — End: ?

## 2023-03-25 MED ORDER — LORAZEPAM 0.5 MG PO TABS: 0.5000 mg | ORAL_TABLET | Freq: Three times a day (TID) | ORAL | 0 refills | Status: AC | PRN

## 2023-03-25 MED ORDER — FLUOXETINE HCL 20 MG PO CAPS: 20.0000 mg | ORAL_CAPSULE | Freq: Every day | ORAL | 1 refills | Status: AC

## 2023-03-25 MED ORDER — METHYLPHENIDATE HCL ER (LA) 30 MG PO CP24: 30.0000 mg | ORAL_CAPSULE | Freq: Every day | ORAL | 0 refills | Status: AC

## 2023-03-25 NOTE — Progress Notes (Signed)
 Gary Solomon [id number] 1995/02/24 28 y.o.  Virtual Visit via Video Note  I connected with pt @ on 03/25/23 at  4:00 PM EDT by a video enabled telemedicine application and verified that I am speaking with the correct person using two identifiers.   I discussed the limitations of evaluation and management by telemedicine and the availability of in person appointments. The patient expressed understanding and agreed to proceed.  I discussed the assessment and treatment plan with the patient. The patient was provided an opportunity to ask questions and all were answered. The patient agreed with the plan and demonstrated an understanding of the instructions.   The patient was advised to call back or seek an in-person evaluation if the symptoms worsen or if the condition fails to improve as anticipated.  I provided 25 minutes of non-face-to-face time during this encounter.  The patient was located at home.  The provider was located at [facility].   Dorothyann Gibbs, NP   Subjective:   Patient ID:  Gary Solomon is a 28 y.o. (DOB 1995-04-05) male.  Chief Complaint: No chief complaint on file.   HPI ZIGMUND LINSE presents for follow-up of ADHD, OCD, atypical dysthymia, and reading disorder.  Describes mood today as "ok". Pleasant. Denies tearfulness. Mood symptoms - reports depression and anxiety. Reports decreased interest and motivation. Denies irritability. Denies recent panic attacks. Reports worry, rumination and over thinking - "a lot of each". Reports difficulties being out in public - "holding breath and trying to get away from people". Mood is variable - "on the lower end lately". Stating "I feel I'm doing worse since I stopped the medication". Reports planning to be more compliant with medication. Energy levels lower - napping more. Active, does not have a regular exercise routine.  Enjoys some usual interests and activities. Single. Lives at home with mother,  brother, cat and dog. Spending time with family. Appetite varies - "can't tell he's hungry until his body tells him". Reports weight stable. Sleep schedule varies. Reports daytime napping. Reports focus and concentration improved - "but to a detriment". Taking Ritalin when needed - not taking currently. Completing tasks. Managing aspects of household.  Denies SI or HI.  Denies AH or VH. Denies self harm. Denies substance use.  Previous medication trials: Remeron.   Review of Systems:  Review of Systems  Musculoskeletal:  Negative for gait problem.  Neurological:  Negative for tremors.  Psychiatric/Behavioral:         Please refer to HPI   Medications: I have reviewed the patient's current medications.  Current Outpatient Medications  Medication Sig Dispense Refill   FLUoxetine (PROZAC) 10 MG capsule Take 1 capsule along with a 20 mg capsule to = 30 mg. 90 capsule 1   FLUoxetine (PROZAC) 20 MG capsule Take 1 capsule (20 mg total) by mouth daily. 90 capsule 1   LORazepam (ATIVAN) 0.5 MG tablet Take 1 tablet (0.5 mg total) by mouth 3 (three) times daily as needed for anxiety (agitation). 30 tablet 0   methylphenidate (RITALIN LA) 30 MG 24 hr capsule Take 1 capsule (30 mg total) by mouth daily after breakfast. 30 capsule 0   methylphenidate (RITALIN LA) 30 MG 24 hr capsule Take 1 capsule (30 mg total) by mouth daily after breakfast. 30 capsule 0   methylphenidate (RITALIN LA) 30 MG 24 hr capsule Take 1 capsule (30 mg total) by mouth daily after breakfast. 30 capsule 0   No current facility-administered medications for this visit.  Medication Side Effects: None  Allergies: No Known Allergies  Past Medical History:  Diagnosis Date   ADHD (attention deficit hyperactivity disorder)     Family History  Problem Relation Age of Onset   Depression Mother     Social History   Socioeconomic History   Marital status: Single    Spouse name: Not on file   Number of children: Not  on file   Years of education: Not on file   Highest education level: Not on file  Occupational History   Not on file  Tobacco Use   Smoking status: Never   Smokeless tobacco: Never  Vaping Use   Vaping status: Never Used  Substance and Sexual Activity   Alcohol use: No   Drug use: No   Sexual activity: Never  Other Topics Concern   Not on file  Social History Narrative   Not on file   Social Drivers of Health   Financial Resource Strain: Not on file  Food Insecurity: Not on file  Transportation Needs: Not on file  Physical Activity: Not on file  Stress: Not on file  Social Connections: Not on file  Intimate Partner Violence: Not on file    Past Medical History, Surgical history, Social history, and Family history were reviewed and updated as appropriate.   Please see review of systems for further details on the patient's review from today.   Objective:   Physical Exam:  There were no vitals taken for this visit.  Physical Exam Constitutional:      General: He is not in acute distress. Musculoskeletal:        General: No deformity.  Neurological:     Mental Status: He is alert and oriented to person, place, and time.     Coordination: Coordination normal.  Psychiatric:        Attention and Perception: Attention and perception normal. He does not perceive auditory or visual hallucinations.        Mood and Affect: Affect is not labile, blunt, angry or inappropriate.        Speech: Speech normal.        Behavior: Behavior normal.        Thought Content: Thought content normal. Thought content is not paranoid or delusional. Thought content does not include homicidal or suicidal ideation. Thought content does not include homicidal or suicidal plan.        Cognition and Memory: Cognition and memory normal.        Judgment: Judgment normal.     Comments: Insight intact     Lab Review:     Component Value Date/Time   NA 140 08/27/2021 0829   K 4.0 08/27/2021 0829    CL 102 08/27/2021 0829   CO2 26 08/27/2021 0829   GLUCOSE 95 08/27/2021 0829   BUN 17 08/27/2021 0829   CREATININE 1.16 08/27/2021 0829   CALCIUM 10.2 08/27/2021 0829   PROT 7.6 08/27/2021 0829   ALBUMIN 4.9 08/27/2021 0829   AST 28 08/27/2021 0829   ALT 34 08/27/2021 0829   ALKPHOS 70 08/27/2021 0829   BILITOT 0.5 08/27/2021 0829       Component Value Date/Time   WBC 7.3 08/23/2021 1612   RBC 5.59 08/23/2021 1612   HGB 15.6 08/23/2021 1612   HCT 47.1 08/23/2021 1612   PLT 285.0 08/23/2021 1612   MCV 84.3 08/23/2021 1612   MCHC 33.0 08/23/2021 1612   RDW 12.8 08/23/2021 1612    No results found for: "  POCLITH", "LITHIUM"   No results found for: "PHENYTOIN", "PHENOBARB", "VALPROATE", "CBMZ"   .res Assessment: Plan:    Plan:  PDMP reviewed  Ritalin 30 mg LA every morning - taking as needed Lorazepam 0.5mg  - 3 times daily - taking as needed Prozac 30mg  daily - not taking consistently - plans to start.  RTC 6 months  25 minutes spent dedicated to the care of this patient on the date of this encounter to include pre-visit review of records, ordering of medication, post visit documentation, and face-to-face time with the patient discussing ADHD, OCD, atypical dysthymia, and reading disorder. Discussed continuing current medication regimen.  Patient advised to contact office with any questions, adverse effects, or acute worsening in signs and symptoms.  Discussed potential benefits, risk, and side effects of benzodiazepines to include potential risk of tolerance and dependence, as well as possible drowsiness.  Advised patient not to drive if experiencing drowsiness and to take lowest possible effective dose to minimize risk of dependence and tolerance.   Discussed potential benefits, risks, and side effects of stimulants with patient to include increased heart rate, palpitations, insomnia, increased anxiety, increased irritability, or decreased appetite. Instructed patient  to contact office if experiencing any significant tolerability issues.   There are no diagnoses linked to this encounter.   Please see After Visit Summary for patient specific instructions.  Future Appointments  Date Time Provider Department Center  03/25/2023  4:00 PM Odie Edmonds, Thereasa Solo, NP [facility] None    No orders of the defined types were placed in this encounter.     -------------------------------

## 2023-09-25 ENCOUNTER — Telehealth: Payer: Self-pay | Admitting: Adult Health

## 2024-01-08 ENCOUNTER — Other Ambulatory Visit: Payer: Self-pay

## 2024-01-08 ENCOUNTER — Encounter (HOSPITAL_BASED_OUTPATIENT_CLINIC_OR_DEPARTMENT_OTHER): Payer: Self-pay | Admitting: Emergency Medicine

## 2024-01-08 ENCOUNTER — Emergency Department (HOSPITAL_BASED_OUTPATIENT_CLINIC_OR_DEPARTMENT_OTHER)
Admission: EM | Admit: 2024-01-08 | Discharge: 2024-01-08 | Disposition: A | Discharge status: Home / Self Care | Payer: Self-pay | Attending: Emergency Medicine | Admitting: Emergency Medicine

## 2024-01-08 DIAGNOSIS — E860 Dehydration: Secondary | ICD-10-CM | POA: Insufficient documentation

## 2024-01-08 DIAGNOSIS — J101 Influenza due to other identified influenza virus with other respiratory manifestations: Secondary | ICD-10-CM | POA: Insufficient documentation

## 2024-01-08 HISTORY — DX: Calculus of kidney: N20.0

## 2024-01-08 LAB — RESP PANEL BY RT-PCR (RSV, FLU A&B, COVID)  RVPGX2
Influenza A by PCR: POSITIVE — AB
Influenza B by PCR: NEGATIVE
Resp Syncytial Virus by PCR: NEGATIVE
SARS Coronavirus 2 by RT PCR: NEGATIVE

## 2024-01-08 LAB — LIPASE, BLOOD: Lipase: 20 U/L (ref 11–51)

## 2024-01-08 LAB — COMPREHENSIVE METABOLIC PANEL WITH GFR
ALT: 24 U/L (ref 0–44)
AST: 24 U/L (ref 15–41)
Albumin: 5 g/dL (ref 3.5–5.0)
Alkaline Phosphatase: 88 U/L (ref 38–126)
Anion gap: 14 (ref 5–15)
BUN: 14 mg/dL (ref 6–20)
CO2: 25 mmol/L (ref 22–32)
Calcium: 10.1 mg/dL (ref 8.9–10.3)
Chloride: 101 mmol/L (ref 98–111)
Creatinine, Ser: 1.27 mg/dL — ABNORMAL HIGH (ref 0.61–1.24)
GFR, Estimated: >60 mL/min
Glucose, Bld: 109 mg/dL — ABNORMAL HIGH (ref 70–99)
Potassium: 4 mmol/L (ref 3.5–5.1)
Sodium: 140 mmol/L (ref 135–145)
Total Bilirubin: 1.1 mg/dL (ref 0.0–1.2)
Total Protein: 7.7 g/dL (ref 6.5–8.1)

## 2024-01-08 LAB — CBC
HCT: 49 % (ref 39.0–52.0)
Hemoglobin: 16.6 g/dL (ref 13.0–17.0)
MCH: 27.5 pg (ref 26.0–34.0)
MCHC: 33.9 g/dL (ref 30.0–36.0)
MCV: 81.3 fL (ref 80.0–100.0)
Platelets: 297 K/uL (ref 150–400)
RBC: 6.03 MIL/uL — ABNORMAL HIGH (ref 4.22–5.81)
RDW: 12.3 % (ref 11.5–15.5)
WBC: 10.8 K/uL — ABNORMAL HIGH (ref 4.0–10.5)
nRBC: 0 % (ref 0.0–0.2)

## 2024-01-08 LAB — GROUP A STREP BY PCR: Group A Strep by PCR: NOT DETECTED

## 2024-01-08 MED ORDER — ONDANSETRON 4 MG PO TBDP: 4.0000 mg | ORAL_TABLET | Freq: Three times a day (TID) | ORAL | 0 refills | Status: AC | PRN

## 2024-01-08 MED ORDER — ACETAMINOPHEN 500 MG PO TABS
1000.0000 mg | ORAL_TABLET | Freq: Once | ORAL | Status: AC
  Administered 2024-01-08: 1000 mg via ORAL
  Filled 2024-01-08: qty 2

## 2024-01-08 MED ORDER — IBUPROFEN 400 MG PO TABS
600.0000 mg | ORAL_TABLET | Freq: Once | ORAL | Status: AC
  Administered 2024-01-08: 600 mg via ORAL
  Filled 2024-01-08: qty 1

## 2024-01-08 MED ORDER — SODIUM CHLORIDE 0.9 % IV BOLUS
1000.0000 mL | Freq: Once | INTRAVENOUS | Status: AC
  Administered 2024-01-08: 1000 mL via INTRAVENOUS

## 2024-01-08 MED ORDER — ONDANSETRON 4 MG PO TBDP
4.0000 mg | ORAL_TABLET | Freq: Once | ORAL | Status: AC | PRN
  Administered 2024-01-08: 4 mg via ORAL
  Filled 2024-01-08: qty 1

## 2024-01-08 MED ORDER — XOFLUZA (40 MG DOSE) 1 X 40 MG PO TBPK: 40.0000 mg | ORAL_TABLET | Freq: Once | ORAL | 0 refills | Status: AC

## 2024-01-08 MED ORDER — ONDANSETRON HCL 4 MG/2ML IJ SOLN
4.0000 mg | Freq: Once | INTRAMUSCULAR | Status: AC
  Administered 2024-01-08: 4 mg via INTRAVENOUS
  Filled 2024-01-08: qty 2

## 2024-01-08 NOTE — ED Notes (Signed)
 Ask pt if he needed to use the bathroom so I get an urine sample. Pt stated he doesn't have to.

## 2024-01-08 NOTE — ED Provider Notes (Signed)
 " [place] [facility] AT [facility] Provider Note   CSN: [id number] Arrival date & time: 01/08/24  9847     Patient presents with: URI and Emesis   Gary Solomon is a 28 y.o. male.   The history is provided by the patient and a parent.  URI Emesis Associated symptoms: URI   Gary Solomon is a 28 y.o. male who presents to the Emergency Department complaining of flulike illness.  He presents to the emergency department accompanied by his mother for evaluation of sore throat, vomiting, cough and bodyaches with subjective fever.  He has mild associated abdominal pain and.  No diarrhea.  He was concerned that he might have strep throat.       Prior to Admission medications  Medication Sig Start Date End Date Taking? Authorizing Provider  Baloxavir Marboxil ,40 MG Dose, (XOFLUZA , 40 MG DOSE,) 1 x 40 MG TBPK Take 40 mg by mouth once for 1 dose. 01/08/24 01/08/24 Yes Griselda Norris, MD  ondansetron  (ZOFRAN -ODT) 4 MG disintegrating tablet Take 1 tablet (4 mg total) by mouth every 8 (eight) hours as needed. 01/08/24  Yes Griselda Norris, MD  FLUoxetine  (PROZAC ) 10 MG capsule Take 1 capsule along with a 20 mg capsule to = 30 mg. 03/25/23   Mozingo, Regina Nattalie, NP  FLUoxetine  (PROZAC ) 20 MG capsule Take 1 capsule (20 mg total) by mouth daily. 03/25/23   Mozingo, Regina Nattalie, NP  LORazepam  (ATIVAN ) 0.5 MG tablet Take 1 tablet (0.5 mg total) by mouth 3 (three) times daily as needed for anxiety (agitation). 03/25/23   Mozingo, Regina Nattalie, NP  methylphenidate  (RITALIN  LA) 30 MG 24 hr capsule Take 1 capsule (30 mg total) by mouth daily after breakfast. 10/27/19 11/26/19  Tonnie Marcey BRAVO, MD  methylphenidate  (RITALIN  LA) 30 MG 24 hr capsule Take 1 capsule (30 mg total) by mouth daily after breakfast. 11/26/19 12/26/19  Tonnie Marcey BRAVO, MD  methylphenidate  (RITALIN  LA) 30 MG 24 hr capsule Take 1 capsule (30 mg total) by mouth daily after breakfast. 03/25/23  04/24/23  Mozingo, Regina Nattalie, NP    Allergies: Patient has no known allergies.    Review of Systems  Gastrointestinal:  Positive for vomiting.  All other systems reviewed and are negative.   Updated Vital Signs BP 109/71   Pulse 98   Temp (!) 101 F (38.3 C) (Oral)   Resp 18   Ht 5' 6 (1.676 m)   Wt 78 kg   SpO2 98%   BMI 27.75 kg/m   Physical Exam Vitals and nursing note reviewed.  Constitutional:      Appearance: He is well-developed.  HENT:     Head: Normocephalic and atraumatic.     Mouth/Throat:     Mouth: Mucous membranes are moist.     Pharynx: No oropharyngeal exudate or posterior oropharyngeal erythema.  Cardiovascular:     Rate and Rhythm: Regular rhythm.     Heart sounds: No murmur heard. Pulmonary:     Effort: Pulmonary effort is normal. No respiratory distress.     Breath sounds: Normal breath sounds.  Abdominal:     Palpations: Abdomen is soft.     Tenderness: There is no abdominal tenderness. There is no guarding or rebound.  Musculoskeletal:        General: No tenderness.  Skin:    General: Skin is warm and dry.  Neurological:     Mental Status: He is alert and oriented to person, place, and time.  Psychiatric:        Behavior: Behavior normal.     (all labs ordered are listed, but only abnormal results are displayed) Labs Reviewed  RESP PANEL BY RT-PCR (RSV, FLU A&B, COVID)  RVPGX2 - Abnormal; Notable for the following components:      Result Value   Influenza A by PCR POSITIVE (*)    All other components within normal limits  COMPREHENSIVE METABOLIC PANEL WITH GFR - Abnormal; Notable for the following components:   Glucose, Bld 109 (*)    Creatinine, Ser 1.27 (*)    All other components within normal limits  CBC - Abnormal; Notable for the following components:   WBC 10.8 (*)    RBC 6.03 (*)    All other components within normal limits  GROUP A STREP BY PCR  LIPASE, BLOOD    EKG: None  Radiology: No results  found.   Procedures   Medications Ordered in the ED  ondansetron  (ZOFRAN -ODT) disintegrating tablet 4 mg (4 mg Oral Given 01/08/24 0207)  sodium chloride  0.9 % bolus 1,000 mL (0 mLs Intravenous Stopped 01/08/24 0630)  ondansetron  (ZOFRAN ) injection 4 mg (4 mg Intravenous Given 01/08/24 0521)  acetaminophen  (TYLENOL ) tablet 1,000 mg (1,000 mg Oral Given 01/08/24 0524)  ibuprofen  (ADVIL ) tablet 600 mg (600 mg Oral Given 01/08/24 9356)                                    Medical Decision Making Amount and/or Complexity of Data Reviewed Labs: ordered.  Risk OTC drugs. Prescription drug management.   Patient here for evaluation of fever, sore throat, body aches and vomiting.  He is nontoxic-appearing on evaluation, does appear mildly dehydrated.  He was treated with IV fluids, antiemetic with improvement in symptoms.  No recurrent vomiting in the department and he is able to tolerate p.o.  He is positive for influenza.  Discussed with patient, mother at the bedside findings of studies.  Discussed home care and supportive measures for influenza with outpatient follow-up and return precautions.     Final diagnoses:  Influenza A  Dehydration    ED Discharge Orders          Ordered    Baloxavir Marboxil ,40 MG Dose, (XOFLUZA , 40 MG DOSE,) 1 x 40 MG TBPK   Once        01/08/24 0633    ondansetron  (ZOFRAN -ODT) 4 MG disintegrating tablet  Every 8 hours PRN        01/08/24 9366               Griselda Norris, MD 01/08/24 [phone]  "

## 2024-01-08 NOTE — ED Triage Notes (Signed)
 Pt states sore throat, emesis and cough. Took dayquil. Emesis getting worse.
